# Patient Record
Sex: Female | Born: 1940 | Race: White | Hispanic: No | Smoking: Never smoker
Health system: Southern US, Community
[De-identification: ages and names within clinical notes are randomized; demographics above are authoritative.]

## PROBLEM LIST (undated history)

## (undated) DIAGNOSIS — E7800 Pure hypercholesterolemia, unspecified: Secondary | ICD-10-CM

## (undated) DIAGNOSIS — M1990 Unspecified osteoarthritis, unspecified site: Secondary | ICD-10-CM

## (undated) HISTORY — PX: PARTIAL HYSTERECTOMY: SHX80

---

## 1982-02-01 HISTORY — PX: BREAST BIOPSY: SHX20

## 1998-07-24 ENCOUNTER — Other Ambulatory Visit: Admission: RE | Admit: 1998-07-24 | Discharge: 1998-07-24 | Payer: Self-pay | Admitting: Internal Medicine

## 1998-09-03 ENCOUNTER — Other Ambulatory Visit: Admission: RE | Admit: 1998-09-03 | Discharge: 1998-09-03 | Payer: Self-pay | Admitting: Obstetrics and Gynecology

## 1998-09-25 ENCOUNTER — Ambulatory Visit (HOSPITAL_COMMUNITY): Admission: RE | Admit: 1998-09-25 | Discharge: 1998-09-25 | Payer: Self-pay | Admitting: Gastroenterology

## 1999-05-21 ENCOUNTER — Ambulatory Visit (HOSPITAL_COMMUNITY): Admission: RE | Admit: 1999-05-21 | Discharge: 1999-05-21 | Payer: Self-pay | Admitting: Obstetrics and Gynecology

## 2000-05-03 ENCOUNTER — Other Ambulatory Visit: Admission: RE | Admit: 2000-05-03 | Discharge: 2000-05-03 | Payer: Self-pay | Admitting: *Deleted

## 2000-10-11 ENCOUNTER — Encounter: Admission: RE | Admit: 2000-10-11 | Discharge: 2001-01-09 | Payer: Self-pay | Admitting: *Deleted

## 2001-11-22 ENCOUNTER — Observation Stay (HOSPITAL_COMMUNITY): Admission: RE | Admit: 2001-11-22 | Discharge: 2001-11-23 | Payer: Self-pay | Admitting: Obstetrics and Gynecology

## 2003-09-10 ENCOUNTER — Encounter: Admission: RE | Admit: 2003-09-10 | Discharge: 2003-09-10 | Payer: Self-pay | Admitting: Internal Medicine

## 2003-09-23 ENCOUNTER — Ambulatory Visit (HOSPITAL_COMMUNITY): Admission: RE | Admit: 2003-09-23 | Discharge: 2003-09-23 | Payer: Self-pay | Admitting: Gastroenterology

## 2003-12-05 ENCOUNTER — Encounter: Admission: RE | Admit: 2003-12-05 | Discharge: 2003-12-05 | Payer: Self-pay | Admitting: Neurosurgery

## 2004-06-10 ENCOUNTER — Ambulatory Visit (HOSPITAL_COMMUNITY): Admission: RE | Admit: 2004-06-10 | Discharge: 2004-06-10 | Payer: Self-pay | Admitting: General Surgery

## 2014-02-25 ENCOUNTER — Encounter: Payer: Self-pay | Admitting: Podiatry

## 2014-02-25 ENCOUNTER — Ambulatory Visit: Payer: Self-pay

## 2014-02-25 ENCOUNTER — Ambulatory Visit (INDEPENDENT_AMBULATORY_CARE_PROVIDER_SITE_OTHER): Payer: Medicare Other | Admitting: Podiatry

## 2014-02-25 VITALS — BP 150/69 | HR 109 | Resp 16

## 2014-02-25 DIAGNOSIS — S99921A Unspecified injury of right foot, initial encounter: Secondary | ICD-10-CM

## 2014-02-25 DIAGNOSIS — L600 Ingrowing nail: Secondary | ICD-10-CM

## 2014-02-25 NOTE — Progress Notes (Signed)
   Subjective:    Patient ID: Sarah Benitez, female    DOB: 07/11/1940, 74 y.o.   MRN: [id number]  HPI Comments: "I have a bad toenail"  Patient c/o thick, discolored 1st toenails bilateral for several years. She injured the the right big toe years ago. Can't cut them. Gets sore occasionally.     Review of Systems  Gastrointestinal: Positive for diarrhea.  Musculoskeletal: Positive for back pain and arthralgias.  All other systems reviewed and are negative.      Objective:   Physical Exam        Assessment & Plan:

## 2014-02-25 NOTE — Progress Notes (Signed)
Subjective:     Patient ID: Sarah LickNancy S Benitez, female   DOB: 01/04/1941, 74 y.o.   MRN: [id number]  HPI patient is found to have brittle thick painful nailbeds hallux bilateral that she cannot cut they're becoming loose and increasingly difficult to wear shoe gear with   Review of Systems  All other systems reviewed and are negative.      Objective:   Physical Exam  Constitutional: She is oriented to person, place, and time.  Cardiovascular: Intact distal pulses.   Musculoskeletal: Normal range of motion.  Neurological: She is oriented to person, place, and time.  Skin: Skin is warm.  Nursing note and vitals reviewed.  neurovascular status found to be intact with muscle strength adequate and range of motion subtalar and midtarsal joint within normal limits. 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 I have recommended removal of the nails and I explained procedure and risk and the fact they will most likely not regrow. Patient wants the surgery and understands risk and today I infiltrated 60 mg Xylocaine Marcaine mixture remove each hallux nail exposed the matrix and apply chemical phenol 3 applications 30 seconds followed by alcohol lavaged and sterile dressing. Reappoint for us to recheck

## 2014-02-25 NOTE — Patient Instructions (Signed)

## 2014-03-22 ENCOUNTER — Encounter: Payer: Self-pay | Admitting: Podiatry

## 2014-03-22 ENCOUNTER — Telehealth: Payer: Self-pay | Admitting: *Deleted

## 2014-03-22 ENCOUNTER — Ambulatory Visit (INDEPENDENT_AMBULATORY_CARE_PROVIDER_SITE_OTHER): Payer: Medicare Other | Admitting: Podiatry

## 2014-03-22 VITALS — BP 133/80 | HR 67 | Resp 12

## 2014-03-22 DIAGNOSIS — Z9889 Other specified postprocedural states: Secondary | ICD-10-CM

## 2014-03-22 DIAGNOSIS — L03039 Cellulitis of unspecified toe: Secondary | ICD-10-CM

## 2014-03-22 MED ORDER — CEPHALEXIN 500 MG PO CAPS: 500.0000 mg | ORAL_CAPSULE | Freq: Three times a day (TID) | ORAL | Status: DC

## 2014-03-22 NOTE — Patient Instructions (Signed)

## 2014-03-22 NOTE — Telephone Encounter (Signed)
Pt states had toenails removed about 4 weeks ago and now 1 is red, swollen from nail base to the 1st joint.  I encouraged pt to be seen today by our doctor, pt agreed.  Transferred to schedulers.

## 2014-03-25 NOTE — Progress Notes (Signed)
Patient ID: Sarah LickNancy S Nordlund, female   DOB: 08/15/1940, 74 y.o.   MRN: [id number]  Subjective: 74 year old female presents the office today with complaints of bilateral big toenails are still tender and had a small amount of clear drainage and bleeding. She recently underwent bilateral hallux nail excision on 02/25/2014. She states that she has continue to soak and antibacterial soap soaks daily, with antibiotic ointment and a Band-Aid. She states there is a small amount of redness around the nail sites however she denies any red streaking. She denies any purulence from the area. Denies any systemic complaints as fevers, chills, nausea, vomiting. No other complaints at this time no acute changes since last appointment.  Objective: AAO 3, NAD DP/PT pulses palpable, CRT less than 3 seconds Protective sensation intact with Simms Weinstein monofilament, vibratory sensation intact, Achilles tendon reflex intact. Status post bilateral hallux nail excisions. There is a small amount of serosanguineous drainage from along the nail sites. There is mild tenderness along the procedure site. There is mild surrounding erythema directly around the proximal nail border however there is no ascending cellulitis. There is no purulence identified. There is no surrounding fluctuance or crepitus. No malodor. Remaining nails without pathology. No other areas of tenderness to bilateral lower extremity's. No other areas of edema, erythema, increased warmth. No pain with calf compression, swelling, warmth, erythema.  Assessment: 74 year old female presents for bilateral hallux pain, drainage, erythema status post bilateral hallux nail excision with phenol application.  Plan: -Treatment options were discussed with the patient that he alternatives, risks, complications. -At this time recommended to switch to Epsom salt soaks twice a day covering with antibiotic ointment and a Band-Aid. Prescribed Keflex. Monitor for any signs or  symptoms of infection directed to call the office immediately should any occur. Also discuss that after phenol application the area does create a localized burn and she can have small amount of redness or drainage from that as well. -Follow-up in 2 weeks or sooner if any palms are to arise or any worsening of symptoms. In the meantime, encouraged to call the office with any questions, concerns, change in symptoms.

## 2014-04-05 ENCOUNTER — Ambulatory Visit (INDEPENDENT_AMBULATORY_CARE_PROVIDER_SITE_OTHER): Payer: Medicare Other | Admitting: Podiatry

## 2014-04-05 ENCOUNTER — Encounter: Payer: Self-pay | Admitting: Podiatry

## 2014-04-05 VITALS — BP 164/82 | HR 76 | Resp 12

## 2014-04-05 DIAGNOSIS — Z9889 Other specified postprocedural states: Secondary | ICD-10-CM

## 2014-04-05 DIAGNOSIS — M79676 Pain in unspecified toe(s): Secondary | ICD-10-CM

## 2014-04-05 MED ORDER — CEPHALEXIN 500 MG PO CAPS: 500.0000 mg | ORAL_CAPSULE | Freq: Two times a day (BID) | ORAL | Status: DC

## 2014-04-05 NOTE — Patient Instructions (Signed)

## 2014-04-07 NOTE — Progress Notes (Signed)
Patient ID: Sarah LickNancy S Benitez, female   DOB: 01/27/1941, 74 y.o.   MRN: [id number]  Subjective: Sarah Benitez presents the office today for follow-up of pain to bilateral hallux s/p total nail avulsions with chemcial matrixetomy. She has since finished her antibiotics. He does state the left side is still a little red and they are both sore, especially with pressure although she does state this is improving. She  has continued to soak daily with epsom salts and cover with antibiotic ointment and a Band-Aid. She reports a small amount of clear drainage from the area but no pus. Denies any systemic complaints as fevers, chills, nausea, vomiting. No other complaints at this time no acute changes since last appointment.  Objective: AAO 3, NAD DP/PT pulses palpable, CRT less than 3 seconds Protective sensation intact with Simms Weinstein monofilament, vibratory sensation intact, Achilles tendon reflex intact. Status post bilateral hallux nail avulsions with a small scab formation within the nailbed. There is a small amount of serous drainage from along the proximal nail borders on the left > right, but no purulence is expressed. There is a small amount of erythema localized to the proximal nail border on the left and to a smaller amount on the right. There is no ascending cellulitis. There are no areas of fluctuance or crepitance. No malodor.  There is mild tenderness along the procedure site, although she does report it has improved. Remaining nails without pathology. No other areas of tenderness to bilateral lower extremities. No other areas of edema, erythema, increased warmth. No pain with calf compression, swelling, warmth, erythema.  Assessment: 74 year old female presents for follow-up evaluation of bilateral hallux pain/erythema status post bilateral hallux nail excision with phenol application.  Plan: -Treatment options were discussed with the patient that he alternatives, risks, complications. -At this  time recommended to continue soaking in Epsom salt twice a day covering with antibiotic ointment and a Band-Aid. Monitor for any signs or symptoms of infection directed to call the office immediately should any occur. Also discuss that after phenol application the area does create a localized burn and she can have small amount of redness or drainage from that as well. -The proximal nail border was curetted to allow the area to drain. I prescribed keflex in case she notices any purulence or any worsening of erythema.  -Follow-up in 2 weeks or sooner if any problems are to arise or any worsening of symptoms. In the meantime, encouraged to call the office with any questions, concerns, change in symptoms.

## 2014-07-28 ENCOUNTER — Encounter (HOSPITAL_COMMUNITY): Payer: Self-pay | Admitting: Emergency Medicine

## 2014-07-28 ENCOUNTER — Observation Stay (HOSPITAL_COMMUNITY)
Admission: EM | Admit: 2014-07-28 | Discharge: 2014-07-30 | Disposition: A | Discharge status: Home / Self Care | Payer: Medicare Other | Attending: General Surgery | Admitting: General Surgery

## 2014-07-28 ENCOUNTER — Emergency Department (HOSPITAL_COMMUNITY): Payer: Medicare Other

## 2014-07-28 DIAGNOSIS — E780 Pure hypercholesterolemia: Secondary | ICD-10-CM | POA: Insufficient documentation

## 2014-07-28 DIAGNOSIS — Z888 Allergy status to other drugs, medicaments and biological substances status: Secondary | ICD-10-CM | POA: Insufficient documentation

## 2014-07-28 DIAGNOSIS — Z7982 Long term (current) use of aspirin: Secondary | ICD-10-CM | POA: Diagnosis not present

## 2014-07-28 DIAGNOSIS — K810 Acute cholecystitis: Secondary | ICD-10-CM | POA: Diagnosis present

## 2014-07-28 DIAGNOSIS — M1990 Unspecified osteoarthritis, unspecified site: Secondary | ICD-10-CM | POA: Insufficient documentation

## 2014-07-28 DIAGNOSIS — K8012 Calculus of gallbladder with acute and chronic cholecystitis without obstruction: Principal | ICD-10-CM | POA: Insufficient documentation

## 2014-07-28 DIAGNOSIS — R1011 Right upper quadrant pain: Secondary | ICD-10-CM | POA: Diagnosis present

## 2014-07-28 DIAGNOSIS — R109 Unspecified abdominal pain: Secondary | ICD-10-CM

## 2014-07-28 DIAGNOSIS — Z419 Encounter for procedure for purposes other than remedying health state, unspecified: Secondary | ICD-10-CM

## 2014-07-28 DIAGNOSIS — R079 Chest pain, unspecified: Secondary | ICD-10-CM

## 2014-07-28 HISTORY — DX: Unspecified osteoarthritis, unspecified site: M19.90

## 2014-07-28 HISTORY — DX: Pure hypercholesterolemia, unspecified: E78.00

## 2014-07-28 LAB — I-STAT TROPONIN, ED: Troponin i, poc: 0 ng/mL (ref 0.00–0.08)

## 2014-07-28 LAB — COMPREHENSIVE METABOLIC PANEL
ALT: 17 U/L (ref 14–54)
AST: 30 U/L (ref 15–41)
Albumin: 3.8 g/dL (ref 3.5–5.0)
Alkaline Phosphatase: 64 U/L (ref 38–126)
Anion gap: 8 (ref 5–15)
BUN: 15 mg/dL (ref 6–20)
CO2: 25 mmol/L (ref 22–32)
Calcium: 9 mg/dL (ref 8.9–10.3)
Chloride: 108 mmol/L (ref 101–111)
Creatinine, Ser: 0.69 mg/dL (ref 0.44–1.00)
GFR calc Af Amer: >60 mL/min (ref >60)
GFR calc non Af Amer: >60 mL/min (ref >60)
Glucose, Bld: 125 mg/dL — ABNORMAL HIGH (ref 65–99)
Potassium: 3.6 mmol/L (ref 3.5–5.1)
Sodium: 141 mmol/L (ref 135–145)
Total Bilirubin: 1.1 mg/dL (ref 0.3–1.2)
Total Protein: 7.2 g/dL (ref 6.5–8.1)

## 2014-07-28 LAB — CBC
HCT: 39.4 % (ref 36.0–46.0)
Hemoglobin: 13.3 g/dL (ref 12.0–15.0)
MCH: 33.8 pg (ref 26.0–34.0)
MCHC: 33.8 g/dL (ref 30.0–36.0)
MCV: 100 fL (ref 78.0–100.0)
Platelets: 228 10*3/uL (ref 150–400)
RBC: 3.94 MIL/uL (ref 3.87–5.11)
RDW: 12.3 % (ref 11.5–15.5)
WBC: 7.9 10*3/uL (ref 4.0–10.5)

## 2014-07-28 LAB — LIPASE, BLOOD: Lipase: 22 U/L (ref 22–51)

## 2014-07-28 MED ORDER — HYDROCODONE-ACETAMINOPHEN 5-325 MG PO TABS
1.0000 | ORAL_TABLET | ORAL | Status: DC | PRN
  Administered 2014-07-28: 1 via ORAL
  Administered 2014-07-29: 2 via ORAL
  Administered 2014-07-30: 1 via ORAL
  Filled 2014-07-28 (×2): qty 1
  Filled 2014-07-28: qty 2

## 2014-07-28 MED ORDER — LIDOCAINE VISCOUS 2 % MT SOLN
15.0000 mL | Freq: Once | OROMUCOSAL | Status: AC
  Administered 2014-07-28: 15 mL via OROMUCOSAL
  Filled 2014-07-28: qty 15

## 2014-07-28 MED ORDER — ALUM & MAG HYDROXIDE-SIMETH 200-200-20 MG/5ML PO SUSP
15.0000 mL | Freq: Once | ORAL | Status: AC
  Administered 2014-07-28: 15 mL via ORAL
  Filled 2014-07-28: qty 30

## 2014-07-28 MED ORDER — MORPHINE SULFATE 4 MG/ML IJ SOLN
4.0000 mg | Freq: Once | INTRAMUSCULAR | Status: AC
  Administered 2014-07-28: 4 mg via INTRAVENOUS
  Filled 2014-07-28: qty 1

## 2014-07-28 MED ORDER — NABUMETONE 500 MG PO TABS
500.0000 mg | ORAL_TABLET | Freq: Every day | ORAL | Status: DC
  Administered 2014-07-30: 500 mg via ORAL
  Filled 2014-07-28 (×2): qty 1

## 2014-07-28 MED ORDER — FENTANYL CITRATE (PF) 100 MCG/2ML IJ SOLN
50.0000 ug | Freq: Once | INTRAMUSCULAR | Status: AC
  Administered 2014-07-28: 50 ug via INTRAVENOUS
  Filled 2014-07-28: qty 2

## 2014-07-28 MED ORDER — ENOXAPARIN SODIUM 40 MG/0.4ML ~~LOC~~ SOLN
40.0000 mg | Freq: Every day | SUBCUTANEOUS | Status: DC
  Administered 2014-07-28 – 2014-07-29 (×2): 40 mg via SUBCUTANEOUS
  Filled 2014-07-28 (×3): qty 0.4

## 2014-07-28 MED ORDER — ASPIRIN 325 MG PO TABS
325.0000 mg | ORAL_TABLET | Freq: Once | ORAL | Status: AC
  Administered 2014-07-28: 325 mg via ORAL
  Filled 2014-07-28: qty 1

## 2014-07-28 MED ORDER — ONDANSETRON HCL 4 MG/2ML IJ SOLN
4.0000 mg | Freq: Four times a day (QID) | INTRAMUSCULAR | Status: DC | PRN
  Administered 2014-07-29 (×2): 4 mg via INTRAVENOUS
  Filled 2014-07-28: qty 2

## 2014-07-28 MED ORDER — POTASSIUM CHLORIDE IN NACL 20-0.9 MEQ/L-% IV SOLN
INTRAVENOUS | Status: DC
  Administered 2014-07-28: via INTRAVENOUS
  Filled 2014-07-28 (×2): qty 1000

## 2014-07-28 MED ORDER — DEXTROSE 5 % IV SOLN
1.0000 g | Freq: Once | INTRAVENOUS | Status: AC
  Administered 2014-07-28: 1 g via INTRAVENOUS
  Filled 2014-07-28: qty 10

## 2014-07-28 MED ORDER — NITROGLYCERIN 0.4 MG SL SUBL: 0.4000 mg | SUBLINGUAL_TABLET | SUBLINGUAL | Status: DC | PRN

## 2014-07-28 MED ORDER — MORPHINE SULFATE 2 MG/ML IJ SOLN
1.0000 mg | INTRAMUSCULAR | Status: DC | PRN
  Administered 2014-07-28 – 2014-07-29 (×2): 4 mg via INTRAVENOUS
  Administered 2014-07-29: 2 mg via INTRAVENOUS
  Filled 2014-07-28: qty 2
  Filled 2014-07-28: qty 1
  Filled 2014-07-28: qty 2

## 2014-07-28 MED ORDER — LEVOTHYROXINE SODIUM 112 MCG PO TABS
112.0000 ug | ORAL_TABLET | Freq: Every day | ORAL | Status: DC
  Administered 2014-07-30: 112 ug via ORAL
  Filled 2014-07-28 (×3): qty 1

## 2014-07-28 MED ORDER — PIPERACILLIN-TAZOBACTAM 3.375 G IVPB
3.3750 g | Freq: Three times a day (TID) | INTRAVENOUS | Status: DC
  Administered 2014-07-28 – 2014-07-29 (×2): 3.375 g via INTRAVENOUS
  Filled 2014-07-28 (×3): qty 50

## 2014-07-28 NOTE — ED Provider Notes (Signed)
CSN: [id number]     Arrival date & time 07/28/14  1534 History   First MD Initiated Contact with Patient 07/28/14 1559     Chief Complaint  Patient presents with  . Chest Pain  . Abdominal Pain     (Consider location/radiation/quality/duration/timing/severity/associated sxs/prior Treatment) HPI Comments: Sarah Benitez is a 74 y.o. female with a PMHx of arthritis and HLD, who presents to the ED with complaints of epigastric chest/abdominal pain that has been ongoing for 2 days but worsened today after lunch when she ate chicken, mac & cheese, green beans, and a slice of cheese cake. She states the pain is 8/10 pressure-like epigastric/right upper quadrant pain, radiating to between her shoulder blades in the back, previously intermittent but today became more constant, worse with eating, and improved with Tums. Unchanged with exertion or breathing, and not radiating to the jaw or arm. She reports some shortness of breath with exertion intermittently, and since allergy season started she has had a cough intermittently with occasional green sputum production but she states this is completely related to her allergies. She denies any recent fevers, chills, wheezing, hemoptysis, leg swelling, claudication, orthopnea, PND, recent travel/surgery/immobilization, history of DVT/PE, estrogen use or active cancer, nausea, vomiting, diarrhea, constipation, melena, dysuria, hematuria, vaginal bleeding or discharge, numbness, tingling, weakness, diaphoresis, lightheadedness, dizziness, headache, vision changes, suspicious food intake, sick contacts, alcohol use, NSAID use, or antibiotics. She is a nonsmoker. She has a family history of MI in her father and her brother but she has no personal history of cardiac disease.  Patient is a 74 y.o. female presenting with chest pain and abdominal pain. The history is provided by the patient. No language interpreter was used.  Chest Pain Pain location:  Epigastric Pain  quality: pressure   Pain radiates to:  Mid back Pain radiates to the back: yes   Pain severity:  Moderate Onset quality:  Gradual Duration:  2 days Timing:  Constant Progression:  Unchanged Chronicity:  New Context: eating   Relieved by:  Antacids Exacerbated by: eating. Ineffective treatments:  None tried Associated symptoms: abdominal pain, cough (intermittent with allergies, occasionally productive) and heartburn   Associated symptoms: no claudication, no diaphoresis, no dizziness, no fever, no headache, no lower extremity edema, no nausea, no numbness, no orthopnea, no PND, no shortness of breath, not vomiting and no weakness   Risk factors: high cholesterol   Risk factors: no birth control, no coronary artery disease, no diabetes mellitus, no hypertension, no immobilization, no prior DVT/PE, no smoking and no surgery   Abdominal Pain Associated symptoms: chest pain and cough (intermittent with allergies, occasionally productive)   Associated symptoms: no chills, no constipation, no diarrhea, no dysuria, no fever, no hematuria, no nausea, no shortness of breath, no vaginal bleeding, no vaginal discharge and no vomiting     Past Medical History  Diagnosis Date  . Arthritis   . High cholesterol    Past Surgical History  Procedure Laterality Date  . Breast biopsy  1984  . Partial hysterectomy     No family history on file. History  Substance Use Topics  . Smoking status: Never Smoker   . Smokeless tobacco: Not on file  . Alcohol Use: No   OB History    No data available     Review of Systems  Constitutional: Negative for fever, chills and diaphoresis.  Eyes: Negative for visual disturbance.  Respiratory: Positive for cough (intermittent with allergies, occasionally productive). Negative for shortness of breath and  wheezing.   Cardiovascular: Positive for chest pain. Negative for orthopnea, claudication, leg swelling and PND.  Gastrointestinal: Positive for heartburn  and abdominal pain. Negative for nausea, vomiting, diarrhea, constipation and blood in stool.  Genitourinary: Negative for dysuria, hematuria, flank pain, vaginal bleeding and vaginal discharge.  Musculoskeletal: Negative for myalgias, arthralgias and neck pain.  Skin: Negative for color change.  Allergic/Immunologic: Negative for immunocompromised state.  Neurological: Negative for dizziness, weakness, light-headedness, numbness and headaches.  Psychiatric/Behavioral: Negative for confusion.   10 Systems reviewed and are negative for acute change except as noted in the HPI.    Allergies  Allegra; Lipitor; and Betadine  Home Medications   Prior to Admission medications   Medication Sig Start Date End Date Taking? Authorizing Provider  acetaminophen (TYLENOL) 500 MG tablet Take 500 mg by mouth every 6 (six) hours as needed.    Historical Provider, MD  aspirin 81 MG tablet Take 81 mg by mouth daily.    Historical Provider, MD  Calcium Carbonate (CALTRATE 600 PO) Take by mouth.    Historical Provider, MD  cephALEXin (KEFLEX) 500 MG capsule Take 1 capsule (500 mg total) by mouth 2 (two) times daily. 04/05/14   Vivi Barrack, DPM  Ginkgo Biloba (GINKOBA PO) Take by mouth.    Historical Provider, MD  Glucosamine-Chondroitin (GLUCOSAMINE CHONDR COMPLEX PO) Take by mouth.    Historical Provider, MD  levothyroxine (SYNTHROID, LEVOTHROID) 112 MCG tablet Take 112 mcg by mouth daily before breakfast.    Historical Provider, MD  Multiple Vitamin (MULTIVITAMIN) capsule Take 1 capsule by mouth daily.    Historical Provider, MD  nabumetone (RELAFEN) 500 MG tablet Take 500 mg by mouth daily.    Historical Provider, MD  Omega-3 Fatty Acids (OMEGA-3 FISH OIL PO) Take by mouth.    Historical Provider, MD  simvastatin (ZOCOR) 20 MG tablet Take 20 mg by mouth daily.    Historical Provider, MD  ST JOHNS WORT EXTRACT PO Take by mouth.    Historical Provider, MD   BP 177/78 mmHg  Pulse 85  Temp(Src) 97.7  F (36.5 C) (Oral)  Resp 16  SpO2 97% Physical Exam  Constitutional: She is oriented to person, place, and time. Vital signs are normal. She appears well-developed and well-nourished.  Non-toxic appearance. No distress.  Afebrile, nontoxic, NAD  HENT:  Head: Normocephalic and atraumatic.  Mouth/Throat: Oropharynx is clear and moist and mucous membranes are normal.  Eyes: Conjunctivae and EOM are normal. Right eye exhibits no discharge. Left eye exhibits no discharge.  Neck: Normal range of motion. Neck supple.  Cardiovascular: Normal rate, regular rhythm, normal heart sounds and intact distal pulses.  Exam reveals no gallop and no friction rub.   No murmur heard. RRR, nl s1/s2, no m/r/g, distal pulses intact, no pedal edema   Pulmonary/Chest: Effort normal and breath sounds normal. No respiratory distress. She has no decreased breath sounds. She has no wheezes. She has no rhonchi. She has no rales. She exhibits no tenderness, no bony tenderness, no crepitus and no retraction.  CTAB in all lung fields, no w/r/r, no hypoxia or increased WOB, speaking in full sentences, SpO2 97% on RA  No chest wall TTP, no crepitus or deformity, no retractions  Abdominal: Soft. Normal appearance and bowel sounds are normal. She exhibits no distension. There is tenderness in the right upper quadrant and epigastric area. There is positive Murphy's sign. There is no rigidity, no rebound, no guarding, no CVA tenderness and no tenderness at McBurney's point.  Soft, nondistended, +BS throughout, with TTP in epigastrum and RUQ, no r/g/r, +murphy's, neg mcburney's, no CVA TTP   Musculoskeletal: Normal range of motion.  MAE x4 Strength and sensation grossly intact Distal pulses intact No pedal edema, neg homan's bilaterally   Neurological: She is alert and oriented to person, place, and time. She has normal strength. No sensory deficit.  Skin: Skin is warm, dry and intact. No rash noted.  Psychiatric: She has a  normal mood and affect.  Nursing note and vitals reviewed.   ED Course  Procedures (including critical care time) Labs Review Labs Reviewed  COMPREHENSIVE METABOLIC PANEL - Abnormal; Notable for the following:    Glucose, Bld 125 (*)    All other components within normal limits  CBC  LIPASE, BLOOD  I-STAT TROPOININ, ED    Imaging Review Dg Chest 2 View  07/28/2014   CLINICAL DATA:  Epigastric pain for 2 days with back pain neck pain chest discomfort nausea  EXAM: CHEST  2 VIEW  COMPARISON:  None.  FINDINGS: Heart size upper normal. Uncoiling of the aorta. Vascular pattern normal. Poor inspiratory effect leads to bilateral lower lobe atelectasis. No consolidation or effusion.  IMPRESSION: No active cardiopulmonary disease.   Electronically Signed   By: Esperanza Heir M.D.   On: 07/28/2014 16:52   US Abdomen Complete  07/28/2014   CLINICAL DATA:  Right upper quadrant pain. Back pain under the scapula.  EXAM: ULTRASOUND ABDOMEN COMPLETE  COMPARISON:  None.  FINDINGS: Gallbladder: Shadowing for the gallbladder wall may be related to diffuse wall calcification versus stone filled gallbladder. There is a discrete 1.9 cm stone noted at the gallbladder neck with additional stones and sludge within the gallbladder lumen. Gallbladder wall appears thickened measuring 8 mm. There is questionable small amount of pericholecystic fluid. Sonographic Murphy sign is positive.  Common bile duct: Diameter: 1.3 cm proximally, 5 mm distally.  Liver: No focal lesion identified. Within normal limits in parenchymal echogenicity. There is normal directional flow in the main portal vein.  IVC: No abnormality visualized.  Pancreas: Visualized portion unremarkable. Normal caliber pancreatic duct rib visualize measuring 2 mm.  Spleen: Size and appearance within normal limits.  Right Kidney: Length: 11.7 cm. Echogenicity within normal limits. No mass or hydronephrosis visualized.  Left Kidney: Length: 10.8 cm. Echogenicity  within normal limits. No mass or hydronephrosis visualized.  Abdominal aorta: No aneurysm visualized.  Other findings: None.  IMPRESSION: 1. Constellation of findings consistent with acute cholecystitis. Gallbladder filled with echogenic stones and sludge, with a stone at the gallbladder neck. 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 2. Dilated proximal common bile duct measuring 1.3 cm, with normal caliber distal common bile duct measuring 5 mm. 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   Electronically Signed   By: Rubye Oaks M.D.   On: 07/28/2014 19:18     EKG Interpretation   Date/Time:  Sunday July 28 2014 15:42:09 EDT Ventricular Rate:  83 PR Interval:  198 QRS Duration: 80 QT Interval:  427 QTC Calculation: 502 R Axis:   -27 Text Interpretation:  Sinus rhythm Anterolateral infarct, old Prolonged QT  interval Baseline wander in lead(s) III aVL Confirmed by DOCHERTY  MD,  MEGAN ([id number]) on 07/28/2014 3:46:59 PM      MDM   Final diagnoses:  Chest pain  Acute cholecystitis    74 y.o. female here with epigastric/RUQ/LUQ pain x2 days, worsened today after eating meal, radiating to between shoulder  blades, improved somewhat with tums, associated with mild SOB with exertion. Has had intermittent cough with occasional sputum for months, related to her allergies. +FHx of MI in father and brother, but no cardiac disease in herself, and nonsmoker. On exam, pain is in epigastrum and RUQ with +Murphy's. Will get labs, EKG, trop, CXR, abd u/s, and give GI cocktail with ASA. If unrelieved, will try NTG and morphine. No tachycardia or hypoxia, no LE swelling, no RFs for DVT/PE. Will reassess shortly.   7:25 PM Trop neg. CBC unremarkable. CMP showing mildly elevated glucose at 125 without other abnormalities. Lipase WNL. U/S abd showing acute cholecystisis with stone in the GB neck. CXR clear. EKG with long QT but otherwise  WNL. Will consult surgery. Will give rocephin. Pain now increased after U/S, will proceed with morphine. Pain initially had improved but worsened after U/S.  7:41 PM Dr. Magnus Ivan returning page, will consult on pt and proceed with intervention. Please see his notes for further documentation of care.  BP 154/60 mmHg  Pulse 75  Temp(Src) 97.7 F (36.5 C) (Oral)  Resp 22  SpO2 93%  Meds ordered this encounter  Medications  . aspirin tablet 325 mg    Sig:   . nitroGLYCERIN (NITROSTAT) SL tablet 0.4 mg    Sig:   . morphine 4 MG/ML injection 4 mg    Sig:   . alum & mag hydroxide-simeth (MAALOX/MYLANTA) 200-200-20 MG/5ML suspension 15 mL    Sig:   . lidocaine (XYLOCAINE) 2 % viscous mouth solution 15 mL    Sig:   . fentaNYL (SUBLIMAZE) injection 50 mcg    Sig:   . cefTRIAXone (ROCEPHIN) 1 g in dextrose 5 % 50 mL IVPB    Sig:      Abby Tucholski Camprubi-Soms, PA-C 07/28/14 1946  Purvis Sheffield, MD 07/29/14 1638

## 2014-07-28 NOTE — ED Notes (Signed)
Awake. Verbally responsive. A/O x4. Resp even and unlabored. No audible adventitious breath sounds noted. ABC's intact. SR on monitor. IV saline lock patent and intact. Family at bedside. 

## 2014-07-28 NOTE — Progress Notes (Signed)
ANTIBIOTIC CONSULT NOTE - INITIAL  Pharmacy Consult for zosyn Indication: cholecystitis  Allergies  Allergen Reactions  . Allegra [Fexofenadine]   . Lipitor [Atorvastatin]   . Betadine [Povidone Iodine] Rash    Patient Measurements:   Adjusted Body Weight:   Vital Signs: Temp: 98.8 F (37.1 C) (06/26 2235) Temp Source: Oral (06/26 1543) BP: 148/78 mmHg (06/26 2235) Pulse Rate: 84 (06/26 2235) Intake/Output from previous day:   Intake/Output from this shift:    Labs:  Recent Labs  07/28/14 1605  WBC 7.9  HGB 13.3  PLT 228  CREATININE 0.69   CrCl cannot be calculated (Unknown ideal weight.). No results for input(s): VANCOTROUGH, VANCOPEAK, VANCORANDOM, GENTTROUGH, GENTPEAK, GENTRANDOM, TOBRATROUGH, TOBRAPEAK, TOBRARND, AMIKACINPEAK, AMIKACINTROU, AMIKACIN in the last 72 hours.   Microbiology: No results found for this or any previous visit (from the past 720 hour(s)).  Medical History: Past Medical History  Diagnosis Date  . Arthritis   . High cholesterol     Medications:  Anti-infectives    Start     Dose/Rate Route Frequency Ordered Stop   07/28/14 2315  piperacillin-tazobactam (ZOSYN) IVPB 3.375 g     3.375 g 12.5 mL/hr over 240 Minutes Intravenous 3 times per day 07/28/14 2304     07/28/14 1930  cefTRIAXone (ROCEPHIN) 1 g in dextrose 5 % 50 mL IVPB     1 g 100 mL/hr over 30 Minutes Intravenous  Once 07/28/14 1924 07/28/14 2117     Assessment: Patient with abdominal pain.  Zosyn for acute cholecystitis. Height and weight not known at this time.  Goal of Therapy:  Zosyn based on renal function   Plan:  Follow up culture results  Zosyn 3.375g IV Q8H infused over 4hrs.  Follow with height and weight, adjust zosyn if needed  Aleene Davidson Crowford 07/28/2014,11:07 PM

## 2014-07-28 NOTE — ED Notes (Signed)
Pt states that since yesterday she has had pain under her L breast, abdomen and today, between her shoulder blades. Alert and oriented.

## 2014-07-28 NOTE — H&P (Signed)
Sarah Benitez is an 74 y.o. female.   Chief Complaint: Right upper quadrant abdominal pain HPI: This pleasant female presents with a 2 day history of right upper quadrant abdominal pain hurting through to her back.  This occurred after a fatty meal and then got worse today after another fatty meal. The pain is sharp and severe.  She has no nausea or vomiting. She has had no similar attacks of pain. She is otherwise without complaints.  Past Medical History  Diagnosis Date  . Arthritis   . High cholesterol     Past Surgical History  Procedure Laterality Date  . Breast biopsy  1984  . Partial hysterectomy      No family history on file. Social History:  reports that she has never smoked. She does not have any smokeless tobacco history on file. She reports that she does not drink alcohol or use illicit drugs.  Allergies:  Allergies  Allergen Reactions  . Allegra [Fexofenadine]   . Lipitor [Atorvastatin]   . Betadine [Povidone Iodine] Rash     (Not in a hospital admission)  Results for orders placed or performed during the hospital encounter of 07/28/14 (from the past 48 hour(s))  CBC     Status: None   Collection Time: 07/28/14  4:05 PM  Result Value Ref Range   WBC 7.9 4.0 - 10.5 K/uL   RBC 3.94 3.87 - 5.11 MIL/uL   Hemoglobin 13.3 12.0 - 15.0 g/dL   HCT 39.4 36.0 - 46.0 %   MCV 100.0 78.0 - 100.0 fL   MCH 33.8 26.0 - 34.0 pg   MCHC 33.8 30.0 - 36.0 g/dL   RDW 12.3 11.5 - 15.5 %   Platelets 228 150 - 400 K/uL  Comprehensive metabolic panel     Status: Abnormal   Collection Time: 07/28/14  4:05 PM  Result Value Ref Range   Sodium 141 135 - 145 mmol/L   Potassium 3.6 3.5 - 5.1 mmol/L   Chloride 108 101 - 111 mmol/L   CO2 25 22 - 32 mmol/L   Glucose, Bld 125 (H) 65 - 99 mg/dL   BUN 15 6 - 20 mg/dL   Creatinine, Ser 0.69 0.44 - 1.00 mg/dL   Calcium 9.0 8.9 - 10.3 mg/dL   Total Protein 7.2 6.5 - 8.1 g/dL   Albumin 3.8 3.5 - 5.0 g/dL   AST 30 15 - 41 U/L   ALT 17 14 -  54 U/L   Alkaline Phosphatase 64 38 - 126 U/L   Total Bilirubin 1.1 0.3 - 1.2 mg/dL   GFR calc non Af Amer >60 >60 mL/min   GFR calc Af Amer >60 >60 mL/min    Comment: (NOTE) The eGFR has been calculated using the CKD EPI equation. This calculation has not been validated in all clinical situations. eGFR's persistently <60 mL/min signify possible Chronic Kidney Disease.    Anion gap 8 5 - 15  Lipase, blood     Status: None   Collection Time: 07/28/14  4:05 PM  Result Value Ref Range   Lipase 22 22 - 51 U/L  I-stat troponin, ED  (not at [facility])     Status: None   Collection Time: 07/28/14  4:18 PM  Result Value Ref Range   Troponin i, poc 0.00 0.00 - 0.08 ng/mL   Comment 3            Comment: Due to the release kinetics of cTnI, a negative result within the  first hours of the onset of symptoms does not rule out myocardial infarction with certainty. If myocardial infarction is still suspected, repeat the test at appropriate intervals.    Dg Chest 2 View  07/28/2014   CLINICAL DATA:  Epigastric pain for 2 days with back pain neck pain chest discomfort nausea  EXAM: CHEST  2 VIEW  COMPARISON:  None.  FINDINGS: Heart size upper normal. Uncoiling of the aorta. Vascular pattern normal. Poor inspiratory effect leads to bilateral lower lobe atelectasis. No consolidation or effusion.  IMPRESSION: No active cardiopulmonary disease.   Electronically Signed   By: Skipper Cliche M.D.   On: 07/28/2014 16:52   US Abdomen Complete  07/28/2014   CLINICAL DATA:  Right upper quadrant pain. Back pain under the scapula.  EXAM: ULTRASOUND ABDOMEN COMPLETE  COMPARISON:  None.  FINDINGS: Gallbladder: Shadowing for the gallbladder wall may be related to diffuse wall calcification versus stone filled gallbladder. There is a discrete 1.9 cm stone noted at the gallbladder neck with additional stones and sludge within the gallbladder lumen. Gallbladder wall appears thickened measuring 8 mm. There is  questionable small amount of pericholecystic fluid. Sonographic Murphy sign is positive.  Common bile duct: Diameter: 1.3 cm proximally, 5 mm distally.  Liver: No focal lesion identified. Within normal limits in parenchymal echogenicity. There is normal directional flow in the main portal vein.  IVC: No abnormality visualized.  Pancreas: Visualized portion unremarkable. Normal caliber pancreatic duct rib visualize measuring 2 mm.  Spleen: Size and appearance within normal limits.  Right Kidney: Length: 11.7 cm. Echogenicity within normal limits. No mass or hydronephrosis visualized.  Left Kidney: Length: 10.8 cm. Echogenicity within normal limits. No mass or hydronephrosis visualized.  Abdominal aorta: No aneurysm visualized.  Other findings: None.  IMPRESSION: 1. Constellation of findings consistent with acute cholecystitis. Gallbladder filled with echogenic stones and sludge, with a stone at the gallbladder neck. 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 2. Dilated proximal common bile duct measuring 1.3 cm, with normal caliber distal common bile duct measuring 5 mm. 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   Electronically Signed   By: Jeb Levering M.D.   On: 07/28/2014 19:18    Review of Systems  All other systems reviewed and are negative.   Blood pressure 154/60, pulse 75, temperature 97.7 F (36.5 C), temperature source Oral, resp. rate 22, SpO2 93 %. Physical Exam  Constitutional: She is oriented to person, place, and time. She appears well-developed and well-nourished. No distress.  HENT:  Head: Normocephalic and atraumatic.  Right Ear: External ear normal.  Left Ear: External ear normal.  Nose: Nose normal.  Mouth/Throat: No oropharyngeal exudate.  Eyes: Conjunctivae are normal. Pupils are equal, round, and reactive to light. No scleral icterus.  Neck: Normal range of motion. No tracheal deviation present.   Cardiovascular: Normal rate, regular rhythm and normal heart sounds.   No murmur heard. Respiratory: Effort normal and breath sounds normal. No respiratory distress. She has no wheezes.  GI: Soft. There is tenderness. There is guarding.  There is tenderness with guarding in the right upper quadrant  Musculoskeletal: Normal range of motion. She exhibits no edema.  Neurological: She is alert and oriented to person, place, and time.  Skin: Skin is dry.  Psychiatric: Her behavior is normal. Judgment normal.     Assessment/Plan Acute cholecystitis with cholelithiasis  Plan will be to admit the patient to the hospital for IV anabiotic's and eventual cholecystectomy.  I discussed this with the patient and her family and she is in agreement with the admission.  Raenell Mensing A 07/28/2014, 9:29 PM

## 2014-07-28 NOTE — ED Notes (Addendum)
Pt reported lt side chest pain/pressure that radiates to back and lt shoulder blade, (+)SHOB, (-)n/v, diaphoresis, and dizziness/lightheadedness. Pt reported for past couple of day she was having midline abd pain and then yesterday the pain was under lt breast area. Pt reported productive cough to greenish sputum.

## 2014-07-28 NOTE — ED Notes (Signed)
Ultrasound at bedside

## 2014-07-29 ENCOUNTER — Observation Stay (HOSPITAL_COMMUNITY): Payer: Medicare Other | Admitting: Certified Registered"

## 2014-07-29 ENCOUNTER — Encounter (HOSPITAL_COMMUNITY)
Admission: EM | Disposition: A | Discharge status: Home / Self Care | Payer: Self-pay | Source: Home / Self Care | Attending: Emergency Medicine

## 2014-07-29 ENCOUNTER — Observation Stay (HOSPITAL_COMMUNITY): Payer: Medicare Other

## 2014-07-29 ENCOUNTER — Encounter (HOSPITAL_COMMUNITY): Payer: Self-pay | Admitting: General Practice

## 2014-07-29 DIAGNOSIS — K8012 Calculus of gallbladder with acute and chronic cholecystitis without obstruction: Secondary | ICD-10-CM | POA: Diagnosis not present

## 2014-07-29 DIAGNOSIS — E780 Pure hypercholesterolemia: Secondary | ICD-10-CM | POA: Diagnosis not present

## 2014-07-29 DIAGNOSIS — Z7982 Long term (current) use of aspirin: Secondary | ICD-10-CM | POA: Diagnosis not present

## 2014-07-29 DIAGNOSIS — M1990 Unspecified osteoarthritis, unspecified site: Secondary | ICD-10-CM | POA: Diagnosis not present

## 2014-07-29 HISTORY — PX: CHOLECYSTECTOMY: SHX55

## 2014-07-29 LAB — CBC
HCT: 39.9 % (ref 36.0–46.0)
Hemoglobin: 13.6 g/dL (ref 12.0–15.0)
MCH: 33.9 pg (ref 26.0–34.0)
MCHC: 34.1 g/dL (ref 30.0–36.0)
MCV: 99.5 fL (ref 78.0–100.0)
Platelets: 218 10*3/uL (ref 150–400)
RBC: 4.01 MIL/uL (ref 3.87–5.11)
RDW: 12.2 % (ref 11.5–15.5)
WBC: 8.3 10*3/uL (ref 4.0–10.5)

## 2014-07-29 LAB — COMPREHENSIVE METABOLIC PANEL
ALT: 25 U/L (ref 14–54)
AST: 38 U/L (ref 15–41)
Albumin: 3.6 g/dL (ref 3.5–5.0)
Alkaline Phosphatase: 65 U/L (ref 38–126)
Anion gap: 12 (ref 5–15)
BUN: 10 mg/dL (ref 6–20)
CO2: 22 mmol/L (ref 22–32)
Calcium: 8.5 mg/dL — ABNORMAL LOW (ref 8.9–10.3)
Chloride: 102 mmol/L (ref 101–111)
Creatinine, Ser: 0.57 mg/dL (ref 0.44–1.00)
GFR calc Af Amer: >60 mL/min (ref >60)
GFR calc non Af Amer: >60 mL/min (ref >60)
Glucose, Bld: 188 mg/dL — ABNORMAL HIGH (ref 65–99)
Potassium: 3.5 mmol/L (ref 3.5–5.1)
Sodium: 136 mmol/L (ref 135–145)
Total Bilirubin: 1.2 mg/dL (ref 0.3–1.2)
Total Protein: 7.3 g/dL (ref 6.5–8.1)

## 2014-07-29 LAB — SURGICAL PCR SCREEN
MRSA, PCR: NEGATIVE
Staphylococcus aureus: NEGATIVE

## 2014-07-29 SURGERY — LAPAROSCOPIC CHOLECYSTECTOMY WITH INTRAOPERATIVE CHOLANGIOGRAM
Anesthesia: General | Site: Abdomen

## 2014-07-29 MED ORDER — BUPIVACAINE-EPINEPHRINE (PF) 0.25% -1:200000 IJ SOLN
INTRAMUSCULAR | Status: AC
  Filled 2014-07-29: qty 30

## 2014-07-29 MED ORDER — SODIUM CHLORIDE 0.9 % IV SOLN
INTRAVENOUS | Status: DC | PRN
  Administered 2014-07-29: 5 mL

## 2014-07-29 MED ORDER — BUPIVACAINE-EPINEPHRINE 0.25% -1:200000 IJ SOLN
INTRAMUSCULAR | Status: DC | PRN
  Administered 2014-07-29: 10 mL

## 2014-07-29 MED ORDER — NEOSTIGMINE METHYLSULFATE 10 MG/10ML IV SOLN
INTRAVENOUS | Status: DC | PRN
  Administered 2014-07-29: 3 mg via INTRAVENOUS

## 2014-07-29 MED ORDER — LACTATED RINGERS IV SOLN
INTRAVENOUS | Status: DC | PRN
  Administered 2014-07-29: 2000 mL

## 2014-07-29 MED ORDER — PROPOFOL 10 MG/ML IV BOLUS
INTRAVENOUS | Status: DC | PRN
  Administered 2014-07-29: 140 mg via INTRAVENOUS

## 2014-07-29 MED ORDER — ROCURONIUM BROMIDE 100 MG/10ML IV SOLN
INTRAVENOUS | Status: DC | PRN
  Administered 2014-07-29: 30 mg via INTRAVENOUS

## 2014-07-29 MED ORDER — MIDAZOLAM HCL 2 MG/2ML IJ SOLN
INTRAMUSCULAR | Status: AC
  Filled 2014-07-29: qty 2

## 2014-07-29 MED ORDER — FENTANYL CITRATE (PF) 100 MCG/2ML IJ SOLN
INTRAMUSCULAR | Status: AC
  Filled 2014-07-29: qty 2

## 2014-07-29 MED ORDER — DEXAMETHASONE SODIUM PHOSPHATE 10 MG/ML IJ SOLN
INTRAMUSCULAR | Status: AC
  Filled 2014-07-29: qty 1

## 2014-07-29 MED ORDER — METRONIDAZOLE 500 MG PO TABS
500.0000 mg | ORAL_TABLET | Freq: Three times a day (TID) | ORAL | Status: DC
  Administered 2014-07-30: 500 mg via ORAL
  Filled 2014-07-29 (×4): qty 1

## 2014-07-29 MED ORDER — 0.9 % SODIUM CHLORIDE (POUR BTL) OPTIME
TOPICAL | Status: DC | PRN
  Administered 2014-07-29: 5 mL

## 2014-07-29 MED ORDER — HYDROMORPHONE HCL 1 MG/ML IJ SOLN
INTRAMUSCULAR | Status: AC
  Filled 2014-07-29: qty 1

## 2014-07-29 MED ORDER — HYDROMORPHONE HCL 1 MG/ML IJ SOLN
0.2500 mg | INTRAMUSCULAR | Status: DC | PRN
  Administered 2014-07-29 (×2): 0.5 mg via INTRAVENOUS

## 2014-07-29 MED ORDER — LIDOCAINE HCL (PF) 2 % IJ SOLN
INTRAMUSCULAR | Status: DC | PRN
  Administered 2014-07-29: 20 mg via INTRADERMAL

## 2014-07-29 MED ORDER — LIDOCAINE HCL (CARDIAC) 20 MG/ML IV SOLN
INTRAVENOUS | Status: AC
  Filled 2014-07-29: qty 5

## 2014-07-29 MED ORDER — LACTATED RINGERS IV SOLN
INTRAVENOUS | Status: AC
  Administered 2014-07-29: 1000 mL via INTRAVENOUS

## 2014-07-29 MED ORDER — GLYCOPYRROLATE 0.2 MG/ML IJ SOLN
INTRAMUSCULAR | Status: DC | PRN
Start: 2014-07-29 — End: 2014-07-29
  Administered 2014-07-29: 0.4 mg via INTRAVENOUS

## 2014-07-29 MED ORDER — ONDANSETRON HCL 4 MG/2ML IJ SOLN
INTRAMUSCULAR | Status: AC
  Filled 2014-07-29: qty 2

## 2014-07-29 MED ORDER — CIPROFLOXACIN HCL 500 MG PO TABS
500.0000 mg | ORAL_TABLET | Freq: Two times a day (BID) | ORAL | Status: DC
  Administered 2014-07-30: 500 mg via ORAL
  Filled 2014-07-29 (×3): qty 1

## 2014-07-29 MED ORDER — ROCURONIUM BROMIDE 100 MG/10ML IV SOLN
INTRAVENOUS | Status: AC
  Filled 2014-07-29: qty 1

## 2014-07-29 MED ORDER — FENTANYL CITRATE (PF) 100 MCG/2ML IJ SOLN
INTRAMUSCULAR | Status: DC | PRN
  Administered 2014-07-29 (×4): 50 ug via INTRAVENOUS

## 2014-07-29 MED ORDER — NEOSTIGMINE METHYLSULFATE 10 MG/10ML IV SOLN
INTRAVENOUS | Status: AC
  Filled 2014-07-29: qty 1

## 2014-07-29 MED ORDER — HYDROMORPHONE HCL 1 MG/ML IJ SOLN
1.0000 mg | Freq: Once | INTRAMUSCULAR | Status: AC
  Administered 2014-07-29: 1 mg via INTRAVENOUS
  Filled 2014-07-29: qty 1

## 2014-07-29 MED ORDER — PIPERACILLIN-TAZOBACTAM 3.375 G IVPB
3.3750 g | Freq: Three times a day (TID) | INTRAVENOUS | Status: AC
  Administered 2014-07-29 (×2): 3.375 g via INTRAVENOUS
  Filled 2014-07-29 (×2): qty 50

## 2014-07-29 MED ORDER — PROPOFOL 10 MG/ML IV BOLUS
INTRAVENOUS | Status: AC
  Filled 2014-07-29: qty 20

## 2014-07-29 MED ORDER — GLYCOPYRROLATE 0.2 MG/ML IJ SOLN
INTRAMUSCULAR | Status: AC
  Filled 2014-07-29: qty 2

## 2014-07-29 MED ORDER — POTASSIUM CHLORIDE IN NACL 20-0.9 MEQ/L-% IV SOLN
INTRAVENOUS | Status: DC
  Administered 2014-07-29 (×2): via INTRAVENOUS
  Filled 2014-07-29 (×2): qty 1000

## 2014-07-29 MED ORDER — DEXAMETHASONE SODIUM PHOSPHATE 10 MG/ML IJ SOLN
INTRAMUSCULAR | Status: DC | PRN
  Administered 2014-07-29: 10 mg via INTRAVENOUS

## 2014-07-29 MED ORDER — SUCCINYLCHOLINE CHLORIDE 20 MG/ML IJ SOLN
INTRAMUSCULAR | Status: DC | PRN
  Administered 2014-07-29: 100 mg via INTRAVENOUS

## 2014-07-29 SURGICAL SUPPLY — 30 items
APPLIER CLIP 5 13 M/L LIGAMAX5 (MISCELLANEOUS) ×2
CABLE HIGH FREQUENCY MONO STRZ (ELECTRODE) ×2 IMPLANT
CHLORAPREP W/TINT 26ML (MISCELLANEOUS) ×2 IMPLANT
CLIP APPLIE 5 13 M/L LIGAMAX5 (MISCELLANEOUS) ×1 IMPLANT
COVER MAYO STAND STRL (DRAPES) ×2 IMPLANT
DRAPE C-ARM 42X120 X-RAY (DRAPES) ×2 IMPLANT
DRAPE LAPAROSCOPIC ABDOMINAL (DRAPES) ×2 IMPLANT
ELECT REM PT RETURN 9FT ADLT (ELECTROSURGICAL) ×2
ELECTRODE REM PT RTRN 9FT ADLT (ELECTROSURGICAL) ×1 IMPLANT
GLOVE BIO SURGEON STRL SZ 6.5 (GLOVE) ×2 IMPLANT
GLOVE BIOGEL PI IND STRL 7.0 (GLOVE) ×1 IMPLANT
GLOVE BIOGEL PI INDICATOR 7.0 (GLOVE) ×1
GOWN STRL REUS W/TWL 2XL LVL3 (GOWN DISPOSABLE) ×2 IMPLANT
GOWN STRL REUS W/TWL XL LVL3 (GOWN DISPOSABLE) ×4 IMPLANT
KIT BASIN OR (CUSTOM PROCEDURE TRAY) ×2 IMPLANT
LIQUID BAND (GAUZE/BANDAGES/DRESSINGS) ×2 IMPLANT
POUCH SPECIMEN RETRIEVAL 10MM (ENDOMECHANICALS) ×2 IMPLANT
SCISSORS LAP 5X35 DISP (ENDOMECHANICALS) ×2 IMPLANT
SET CHOLANGIOGRAPH MIX (MISCELLANEOUS) IMPLANT
SET IRRIG TUBING LAPAROSCOPIC (IRRIGATION / IRRIGATOR) ×2 IMPLANT
SUT VIC AB 2-0 SH 27 (SUTURE) ×1
SUT VIC AB 2-0 SH 27X BRD (SUTURE) ×1 IMPLANT
SUT VIC AB 4-0 PS2 18 (SUTURE) ×2 IMPLANT
SUT VICRYL 0 UR6 27IN ABS (SUTURE) ×2 IMPLANT
TOWEL OR 17X26 10 PK STRL BLUE (TOWEL DISPOSABLE) ×2 IMPLANT
TOWEL OR NON WOVEN STRL DISP B (DISPOSABLE) ×2 IMPLANT
TRAY LAPAROSCOPIC (CUSTOM PROCEDURE TRAY) ×2 IMPLANT
TROCAR BLADELESS OPT 5 75 (ENDOMECHANICALS) ×2 IMPLANT
TROCAR SLEEVE XCEL 5X75 (ENDOMECHANICALS) ×4 IMPLANT
TROCAR XCEL BLUNT TIP 100MML (ENDOMECHANICALS) ×2 IMPLANT

## 2014-07-29 NOTE — Anesthesia Preprocedure Evaluation (Signed)
Anesthesia Evaluation  Patient identified by MRN, date of birth, ID band Patient awake    Reviewed: Allergy & Precautions, NPO status , Patient's Chart, lab work & pertinent test results  Airway Mallampati: II  TM Distance: >3 FB Neck ROM: Full    Dental no notable dental hx.    Pulmonary neg pulmonary ROS,  breath sounds clear to auscultation  Pulmonary exam normal       Cardiovascular negative cardio ROS Normal cardiovascular examRhythm:Regular Rate:Normal     Neuro/Psych negative neurological ROS  negative psych ROS   GI/Hepatic negative GI ROS, Neg liver ROS,   Endo/Other  negative endocrine ROS  Renal/GU negative Renal ROS  negative genitourinary   Musculoskeletal  (+) Arthritis -, Osteoarthritis,    Abdominal   Peds negative pediatric ROS (+)  Hematology negative hematology ROS (+)   Anesthesia Other Findings   Reproductive/Obstetrics negative OB ROS                             Anesthesia Physical Anesthesia Plan  ASA: II  Anesthesia Plan: General   Post-op Pain Management:    Induction: Intravenous  Airway Management Planned: Oral ETT  Additional Equipment:   Intra-op Plan:   Post-operative Plan: Extubation in OR  Informed Consent: I have reviewed the patients History and Physical, chart, labs and discussed the procedure including the risks, benefits and alternatives for the proposed anesthesia with the patient or authorized representative who has indicated his/her understanding and acceptance.   Dental advisory given  Plan Discussed with: CRNA  Anesthesia Plan Comments:         Anesthesia Quick Evaluation

## 2014-07-29 NOTE — Op Note (Signed)
07/28/2014 - 07/29/2014  10:27 AM  PATIENT:  Sarah Benitez  74 y.o. female  Patient Care Team: Renford Dillsonald Polite, MD as PCP - General (Internal Medicine)  PRE-OPERATIVE DIAGNOSIS:  cholecystitis  POST-OPERATIVE DIAGNOSIS:  cholecystitis  PROCEDURE:  LAPAROSCOPIC CHOLECYSTECTOMY WITH INTRAOPERATIVE CHOLANGIOGRAM  Surgeon(s): Romie LeveeAlicia Eason Housman, MD  ASSISTANT: none   ANESTHESIA:   local and general  EBL:     DRAINS: none   SPECIMEN:  Source of Specimen:  gallbladder  DISPOSITION OF SPECIMEN:  PATHOLOGY  COUNTS:  YES  PLAN OF CARE: patient admitted  PATIENT DISPOSITION:  PACU - hemodynamically stable.  INDICATION: 74 y.o. F with signs and symptoms of cholecystitis.  I recommended cholecystectomy with IOC. Risks such as bleeding, infection, abscess, leak, injury to other organs, need for further treatment, heart attack, death, and other risks were discussed.  I noted a good likelihood this will help address the problem.  Possibility that this will not correct all abdominal symptoms was explained.  Goals of post-operative recovery were discussed as well.     OR FINDINGS: Normal cholangiogram, inflamed and gallbladder with evidence of early necrosis   DESCRIPTION:   The patient was identified & brought into the operating room. The patient was positioned supine with arms tucked. SCDs were active during the entire case. The patient underwent general anesthesia without any difficulty.  The abdomen was prepped and draped in a sterile fashion. A Surgical Timeout was performed and confirmed our plan.  We positioned the patient in reverse [place] & right side up.  I placed a Hassan laparoscopic port through the umbilicus using open entry technique.  Entry was clean. There were no adhesions to the anterior abdominal wall supraumbilically.  We induced carbon dioxide insufflation. Camera inspection revealed no injury.    I proceeded to continue with laparoscopic technique. I placed a 5 mm  port in mid subcostal region, another 5mm port in the right flank near the anterior axillary line, and a 5mm port in the left subxiphoid region obliquely within the falciform ligament.  I turned attention to the right upper quadrant.  The gallbladder was aspirated and thick, dark bile was returned.  The gallbladder fundus was elevated cephalad. I used cautery and blunt dissection to free the peritoneal coverings between the gallbladder and the liver on the posteriolateral and anteriomedial walls.   I used careful blunt and cautery dissection to help get a good critical view of the cystic artery and cystic duct. I did further dissection to free a few centimeters of the  gallbladder off the liver bed to get a good critical view of the infundibulum and cystic duct. I mobilized the cystic artery.  I skeletonized the cystic duct.  After getting a good 360 view, I decided to perform a cholangiogram.  I placed a clip on the infundibulum.   I did a partial cystic duct-otomy and ensured patency. I placed a 5 F cholangiocatheter through a puncture site at the right subcostal ridge of the abdominal wall and directed it into the cystic duct.  This was secured with a clip. We ran a cholangiogram with dilute radio-opaque contrast and continuous fluoroscopy.  Contrast flowed from a side branch consistent with cystic duct cannulization. Contrast flowed up the common hepatic duct into the right and left intrahepatic chains out to secondary radicals. Contrast flowed down the common bile duct easily across the normal ampulla into the duodenum.  This was consistent with a normal cholangiogram.  I removed the cholangiocatheter.  I placed clips  on the cystic duct x3.  I completed cystic duct transection.   I placed clips on the cystic artery x3 with 2 proximally.  I ligated the cystic artery using scissors. I freed the gallbladder from its remaining attachments to the liver. I ensured hemostasis on the gallbladder fossa of the  liver and elsewhere. I inspected the rest of the abdomen & detected no injury nor bleeding elsewhere.  I irrigated the RUQ with normal saline.  I removed the gallbladder through the umbilical port site.  I closed the umbilical fascia using 0 Vicryl stitches.   I closed the skin using 4-0 vicryl stitch.  Sterile dressings were applied. The patient was extubated & arrived in the PACU in stable condition.  I had discussed postoperative care with the patient in the holding area.   I will discuss  operative findings and postoperative goals / instructions with the patient's family.  Instructions are written in the chart as well.

## 2014-07-29 NOTE — Anesthesia Procedure Notes (Signed)
Procedure Name: Intubation Date/Time: 07/29/2014 8:56 AM Performed by: Early OsmondEARGLE, Tuyet Bader E Pre-anesthesia Checklist: Patient identified, Emergency Drugs available, Suction available and Patient being monitored Patient Re-evaluated:Patient Re-evaluated prior to inductionOxygen Delivery Method: Circle System Utilized Preoxygenation: Pre-oxygenation with 100% oxygen Intubation Type: IV induction Ventilation: Mask ventilation without difficulty Laryngoscope Size: Miller and 2 Grade View: Grade I Tube type: Oral Tube size: 7.0 mm Number of attempts: 1 Airway Equipment and Method: Stylet Placement Confirmation: ETT inserted through vocal cords under direct vision,  positive ETCO2 and breath sounds checked- equal and bilateral Secured at: 21 cm Tube secured with: Tape Dental Injury: Teeth and Oropharynx as per pre-operative assessment

## 2014-07-29 NOTE — Anesthesia Postprocedure Evaluation (Signed)
  Anesthesia Post-op Note  Patient: Sarah Benitez  Procedure(s) Performed: Procedure(s) (LRB): LAPAROSCOPIC CHOLECYSTECTOMY WITH INTRAOPERATIVE CHOLANGIOGRAM (N/A)  Patient Location: PACU  Anesthesia Type: general  Level of Consciousness: awake and alert   Airway and Oxygen Therapy: Patient Spontanous Breathing  Post-op Pain: mild  Post-op Assessment: Post-op Vital signs reviewed, Patient's Cardiovascular Status Stable, Respiratory Function Stable, Patent Airway and No signs of Nausea or vomiting  Last Vitals:  Filed Vitals:   07/29/14 1150  BP: 145/71  Pulse: 86  Temp: 36.6 C  Resp: 16    Post-op Vital Signs: stable   Complications: No apparent anesthesia complications

## 2014-07-29 NOTE — Progress Notes (Signed)
Subjective: Pt with nausea and vomiting overnight  Objective: Vital signs in last 24 hours: Temp:  [97.6 F (36.4 C)-98.8 F (37.1 C)] 97.6 F (36.4 C) (06/27 0457) Pulse Rate:  [71-88] 88 (06/27 0457) Resp:  [16-25] 16 (06/27 0457) BP: (144-179)/(59-81) 144/59 mmHg (06/27 0457) SpO2:  [93 %-98 %] 93 % (06/27 0457) Weight:  [84.1 kg (185 lb 6.5 oz)] 84.1 kg (185 lb 6.5 oz) (06/26 2314) Last BM Date: 07/28/14  Intake/Output from previous day: 06/26 0701 - 06/27 0700 In: 575 [I.V.:475; IV Piggyback:100] Out: 400 [Urine:400] Intake/Output this shift:    General appearance: alert and cooperative Resp: clear to auscultation bilaterally Cardio: regular rate and rhythm GI: normal findings: soft, non-tender  Lab Results:  Results for orders placed or performed during the hospital encounter of 07/28/14 (from the past 24 hour(s))  CBC     Status: None   Collection Time: 07/28/14  4:05 PM  Result Value Ref Range   WBC 7.9 4.0 - 10.5 K/uL   RBC 3.94 3.87 - 5.11 MIL/uL   Hemoglobin 13.3 12.0 - 15.0 g/dL   HCT 16.139.4 09.636.0 - 04.546.0 %   MCV 100.0 78.0 - 100.0 fL   MCH 33.8 26.0 - 34.0 pg   MCHC 33.8 30.0 - 36.0 g/dL   RDW 40.912.3 81.111.5 - 91.415.5 %   Platelets 228 150 - 400 K/uL  Comprehensive metabolic panel     Status: Abnormal   Collection Time: 07/28/14  4:05 PM  Result Value Ref Range   Sodium 141 135 - 145 mmol/L   Potassium 3.6 3.5 - 5.1 mmol/L   Chloride 108 101 - 111 mmol/L   CO2 25 22 - 32 mmol/L   Glucose, Bld 125 (H) 65 - 99 mg/dL   BUN 15 6 - 20 mg/dL   Creatinine, Ser 7.820.69 0.44 - 1.00 mg/dL   Calcium 9.0 8.9 - 95.610.3 mg/dL   Total Protein 7.2 6.5 - 8.1 g/dL   Albumin 3.8 3.5 - 5.0 g/dL   AST 30 15 - 41 U/L   ALT 17 14 - 54 U/L   Alkaline Phosphatase 64 38 - 126 U/L   Total Bilirubin 1.1 0.3 - 1.2 mg/dL   GFR calc non Af Amer >60 >60 mL/min   GFR calc Af Amer >60 >60 mL/min   Anion gap 8 5 - 15  Lipase, blood     Status: None   Collection Time: 07/28/14  4:05 PM   Result Value Ref Range   Lipase 22 22 - 51 U/L  [facility]stat troponin, ED  [facility]     Status: None   Collection Time: 07/28/14  4:18 PM  Result Value Ref Range   Troponin i, poc 0.00 0.00 - 0.08 ng/mL   Comment 3          Surgical pcr screen     Status: None   Collection Time: 07/28/14 11:31 PM  Result Value Ref Range   MRSA, PCR NEGATIVE NEGATIVE   Staphylococcus aureus NEGATIVE NEGATIVE  Comprehensive metabolic panel     Status: Abnormal   Collection Time: 07/29/14  4:50 AM  Result Value Ref Range   Sodium 136 135 - 145 mmol/L   Potassium 3.5 3.5 - 5.1 mmol/L   Chloride 102 101 - 111 mmol/L   CO2 22 22 - 32 mmol/L   Glucose, Bld 188 (H) 65 - 99 mg/dL   BUN 10 6 - 20 mg/dL   Creatinine, Ser 2.130.57 0.44 - 1.00  mg/dL   Calcium 8.5 (L) 8.9 - 10.3 mg/dL   Total Protein 7.3 6.5 - 8.1 g/dL   Albumin 3.6 3.5 - 5.0 g/dL   AST 38 15 - 41 U/L   ALT 25 14 - 54 U/L   Alkaline Phosphatase 65 38 - 126 U/L   Total Bilirubin 1.2 0.3 - 1.2 mg/dL   GFR calc non Af Amer >60 >60 mL/min   GFR calc Af Amer >60 >60 mL/min   Anion gap 12 5 - 15  CBC     Status: None   Collection Time: 07/29/14  4:50 AM  Result Value Ref Range   WBC 8.3 4.0 - 10.5 K/uL   RBC 4.01 3.87 - 5.11 MIL/uL   Hemoglobin 13.6 12.0 - 15.0 g/dL   HCT 78.4 69.6 - 29.5 %   MCV 99.5 78.0 - 100.0 fL   MCH 33.9 26.0 - 34.0 pg   MCHC 34.1 30.0 - 36.0 g/dL   RDW 28.4 13.2 - 44.0 %   Platelets 218 150 - 400 K/uL     Studies/Results Radiology     MEDS, Scheduled . enoxaparin (LOVENOX) injection  40 mg Subcutaneous QHS  . levothyroxine  112 mcg Oral QAC breakfast  . nabumetone  500 mg Oral Daily  . piperacillin-tazobactam (ZOSYN)  IV  3.375 g Intravenous 3 times per day     Assessment: <principal problem not specified> Cholecystitis   Plan: The anatomy & physiology of hepatobiliary & pancreatic function was discussed.  The pathophysiology of gallbladder dysfunction was discussed.  Natural history risks  without surgery was discussed.   I feel the risks of no intervention will lead to serious problems that outweigh the operative risks; therefore, I recommended cholecystectomy to remove the pathology.  I explained laparoscopic techniques with possible need for an open approach.  Probable cholangiogram to evaluate the bilary tract was explained as well.    Risks such as bleeding, infection, abscess, leak, injury to other organs, bile duct injury, need for further treatment, heart attack, death, and other risks were discussed.  I noted a good likelihood this will help address the problem.  Possibility that this will not correct all abdominal symptoms was explained.  Goals of post-operative recovery were discussed as well.  We will work to minimize complications.  An educational handout further explaining the pathology and treatment options was given as well.  Questions were answered.  The patient expresses understanding & wishes to proceed with surgery.      Vanita Panda, MD [facility], [place] [phone]   07/29/2014 7:30 AM

## 2014-07-29 NOTE — Progress Notes (Signed)
Consent signed by pt and this nurse. Pt has no more questions. Consent given to OR nurse who is transporting pt down to OR.

## 2014-07-29 NOTE — Transfer of Care (Signed)
Immediate Anesthesia Transfer of Care Note  Patient: Sarah Benitez  Procedure(s) Performed: Procedure(s): LAPAROSCOPIC CHOLECYSTECTOMY WITH INTRAOPERATIVE CHOLANGIOGRAM (N/A)  Patient Location: PACU  Anesthesia Type:General  Level of Consciousness:  sedated, patient cooperative and responds to stimulation  Airway & Oxygen Therapy:Patient Spontanous Breathing and Patient connected to face mask oxgen  Post-op Assessment:  Report given to PACU RN and Post -op Vital signs reviewed and stable  Post vital signs:  Reviewed and stable  Last Vitals:  Filed Vitals:   07/29/14 0457  BP: 144/59  Pulse: 88  Temp: 36.4 C  Resp: 16    Complications: No apparent anesthesia complications

## 2014-07-30 ENCOUNTER — Encounter (HOSPITAL_COMMUNITY): Payer: Self-pay | Admitting: General Surgery

## 2014-07-30 MED ORDER — CIPROFLOXACIN HCL 500 MG PO TABS: 500.0000 mg | ORAL_TABLET | Freq: Two times a day (BID) | ORAL | Status: DC

## 2014-07-30 MED ORDER — HYDROCODONE-ACETAMINOPHEN 5-325 MG PO TABS: 1.0000 | ORAL_TABLET | ORAL | Status: DC | PRN

## 2014-07-30 NOTE — Progress Notes (Signed)
Discharge instructions given along with prescriptions, questions answered

## 2014-07-30 NOTE — Discharge Instructions (Signed)
[facility] ______[facility] °LAPAROSCOPIC SURGERY: POST OP INSTRUCTIONS °Always review your discharge instruction sheet given to you by the facility where your surgery was performed. °IF YOU HAVE DISABILITY OR FAMILY LEAVE FORMS, YOU MUST BRING THEM TO THE OFFICE FOR PROCESSING.   °DO NOT GIVE THEM TO YOUR DOCTOR. ° °1. A prescription for pain medication may be given to you upon discharge.  Take your pain medication as prescribed, if needed.  If narcotic pain medicine is not needed, then you may take acetaminophen (Tylenol) or ibuprofen (Advil) as needed. °2. Take your usually prescribed medications unless otherwise directed. °3. If you need a refill on your pain medication, please contact your pharmacy.  They will contact our office to request authorization. Prescriptions will not be filled after 5pm or on week-ends. °4. You should follow a light diet the first few days after arrival home, such as soup and crackers, etc.  Be sure to include lots of fluids daily. °5. Most patients will experience some swelling and bruising in the area of the incisions.  Ice packs will help.  Swelling and bruising can take several days to resolve.  °6. It is common to experience some constipation if taking pain medication after surgery.  Increasing fluid intake and taking a stool softener (such as Colace) will usually help or prevent this problem from occurring.  A mild laxative (Milk of Magnesia or Miralax) should be taken according to package instructions if there are no bowel movements after 48 hours. °7. Unless discharge instructions indicate otherwise, you may remove your bandages 24-48 hours after surgery, and you may shower at that time.  You may have steri-strips (small skin tapes) in place directly over the incision.  These strips should be left on the skin for 7-10 days.  If your surgeon used skin glue on the incision, you may shower in 24 hours.  The glue will flake off over the next 2-3 weeks.  Any sutures or  staples will be removed at the office during your follow-up visit. °8. ACTIVITIES:  You may resume regular (light) daily activities beginning the next day--such as daily self-care, walking, climbing stairs--gradually increasing activities as tolerated.  You may have sexual intercourse when it is comfortable.  Refrain from any heavy lifting or straining until approved by your doctor. °a. You may drive when you are no longer taking prescription pain medication, you can comfortably wear a seatbelt, and you can safely maneuver your car and apply brakes. °b. RETURN TO WORK:  __________________________________________________________ °9. You should see your doctor in the office for a follow-up appointment approximately 2-3 weeks after your surgery.  Make sure that you call for this appointment within a day or two after you arrive home to insure a convenient appointment time. °10. OTHER INSTRUCTIONS: __________________________________________________________________________________________________________________________ __________________________________________________________________________________________________________________________ °WHEN TO CALL YOUR DOCTOR: °1. Fever over 101.0 °2. Inability to urinate °3. Continued bleeding from incision. °4. Increased pain, redness, or drainage from the incision. °5. Increasing abdominal pain ° °The clinic staff is available to answer your questions during regular business hours.  Please don’t hesitate to call and ask to speak to one of the nurses for clinical concerns.  If you have a medical emergency, go to the nearest emergency room or call 911.  A surgeon from [facility] is always on call at the hospital. °[address], [address], [place], [place]  [address] ? [address] [address], [place], [place]   [address] °[phone] ? [phone] ? FAX [phone] °Web site:   [email or web address] °

## 2014-07-30 NOTE — Discharge Summary (Signed)
Patient ID: Sarah Benitez MRN: [id number] DOB/AGE: 76/07/1940 76 y.o.  Admit date: 07/28/2014 Discharge date: 07/30/2014  Procedures: lap chole with IOC  Consults: None  Reason for Admission: This pleasant female presents with a 2 day history of right upper quadrant abdominal pain hurting through to her back. This occurred after a fatty meal and then got worse today after another fatty meal. The pain is sharp and severe. She has no nausea or vomiting. She has had no similar attacks of pain. She is otherwise without complaints.  Admission Diagnoses:  1. Acute cholecystitis  Hospital Course: The patient was admitted and taken to the OR where she underwent a lap chole with IOC.  She tolerated this well.  On POD 1, she was tolerating a regular diet and having good pain control.  She was stable for dc home.  PE: Abd: soft, appropriately tender, +BS, ND, incisions c/d/i with dermabond  Discharge Diagnoses:  Active Problems:   Acute cholecystitis s/p lap chole  Discharge Medications:   Medication List    TAKE these medications        acetaminophen 500 MG tablet  Commonly known as:  TYLENOL  Take 1,000 mg by mouth 2 (two) times daily.     aspirin 81 MG tablet  Take 81 mg by mouth at bedtime.     CALTRATE 600 PO  Take 1 tablet by mouth at bedtime.     cephALEXin 500 MG capsule  Commonly known as:  KEFLEX  Take 1 capsule (500 mg total) by mouth 2 (two) times daily.     ciprofloxacin 500 MG tablet  Commonly known as:  CIPRO  Take 1 tablet (500 mg total) by mouth 2 (two) times daily.     GINKOBA PO  Take 1 tablet by mouth daily.     GLUCOSAMINE CHONDR COMPLEX PO  Take 1 tablet by mouth daily.     HYDROcodone-acetaminophen 5-325 MG per tablet  Commonly known as:  NORCO/VICODIN  Take 1-2 tablets by mouth every 4 (four) hours as needed for moderate pain.     levothyroxine 112 MCG tablet  Commonly known as:  SYNTHROID, LEVOTHROID  Take 112 mcg by mouth daily before  breakfast.     multivitamin capsule  Take 1 capsule by mouth at bedtime.     nabumetone 500 MG tablet  Commonly known as:  RELAFEN  Take 500 mg by mouth daily.     nystatin 100000 UNIT/GM Powd  Apply 1 Bottle topically 2 (two) times daily as needed (rash).     OMEGA-3 FISH OIL PO  Take 1 capsule by mouth daily.     simvastatin 20 MG tablet  Commonly known as:  ZOCOR  Take 20 mg by mouth at bedtime.     ST JOHNS WORT EXTRACT PO  Take 1 tablet by mouth daily.        Discharge Instructions: Follow-up Information    [facility]2014.   Specialty:  General Surgery   Why:  [facility], 2:45pm, arrive no later than 2:15pm, for paperwork   Contact information:   [address] [place] [address] [phone]       Signed: Letha CapeOSBORNE,Bonnie Overdorf E 07/30/2014, 12:00 PM

## 2015-05-21 DIAGNOSIS — H5213 Myopia, bilateral: Secondary | ICD-10-CM | POA: Diagnosis not present

## 2015-07-03 DIAGNOSIS — E7800 Pure hypercholesterolemia, unspecified: Secondary | ICD-10-CM | POA: Diagnosis not present

## 2015-07-03 DIAGNOSIS — R195 Other fecal abnormalities: Secondary | ICD-10-CM | POA: Diagnosis not present

## 2015-07-03 DIAGNOSIS — Z0000 Encounter for general adult medical examination without abnormal findings: Secondary | ICD-10-CM | POA: Diagnosis not present

## 2015-07-03 DIAGNOSIS — Z6835 Body mass index (BMI) 35.0-35.9, adult: Secondary | ICD-10-CM | POA: Diagnosis not present

## 2015-07-03 DIAGNOSIS — Z1389 Encounter for screening for other disorder: Secondary | ICD-10-CM | POA: Diagnosis not present

## 2015-07-03 DIAGNOSIS — E039 Hypothyroidism, unspecified: Secondary | ICD-10-CM | POA: Diagnosis not present

## 2015-07-03 DIAGNOSIS — E663 Overweight: Secondary | ICD-10-CM | POA: Diagnosis not present

## 2015-07-10 DIAGNOSIS — Z1211 Encounter for screening for malignant neoplasm of colon: Secondary | ICD-10-CM | POA: Diagnosis not present

## 2015-10-22 DIAGNOSIS — Z23 Encounter for immunization: Secondary | ICD-10-CM | POA: Diagnosis not present

## 2015-11-12 IMAGING — US US ABDOMEN COMPLETE
1 series · 13 of 25 positions shown · non-contrast
Comparison: None.

CLINICAL DATA: Right upper quadrant pain. Back pain under the
scapula.

EXAM:
ULTRASOUND ABDOMEN COMPLETE

[Series 1: us abdomen complete · 0.27mm/px · 13 of 92 slices shown]
[im 1/92]
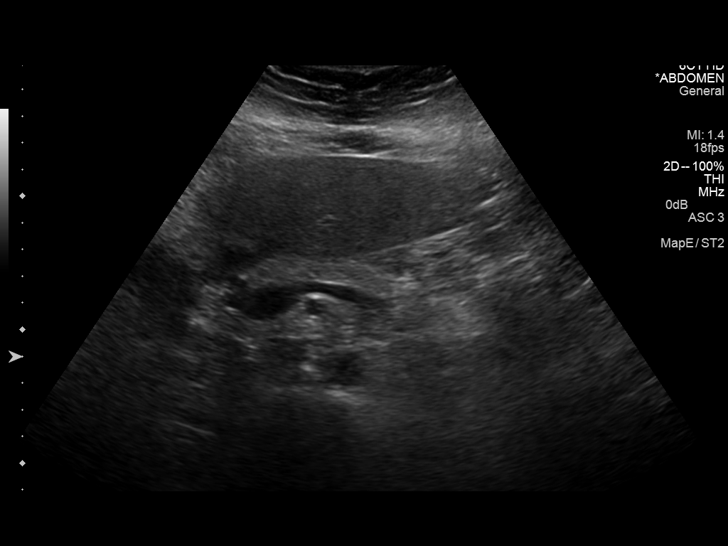
[im 8/92]
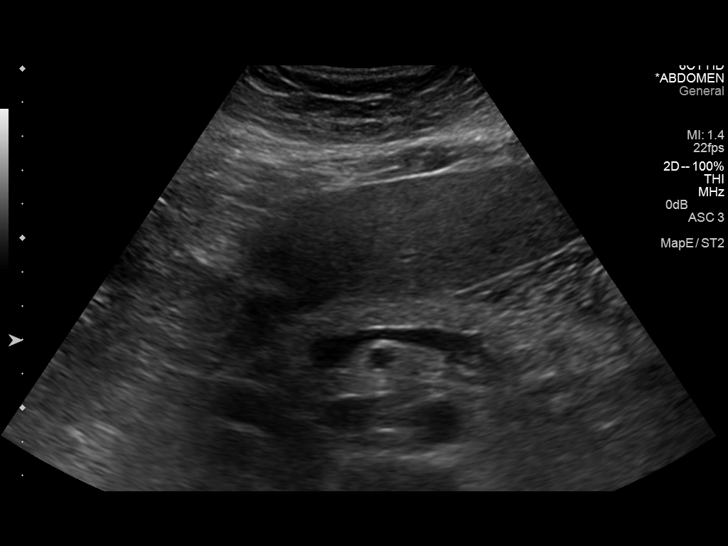
[im 16/92]
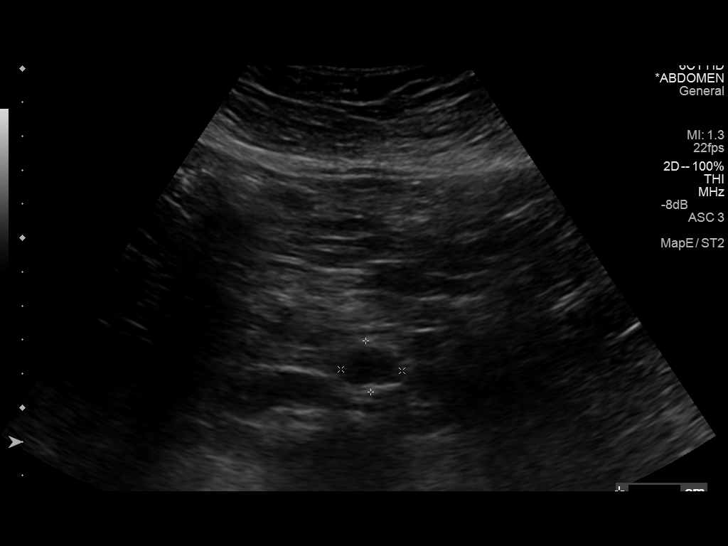
[im 23/92]
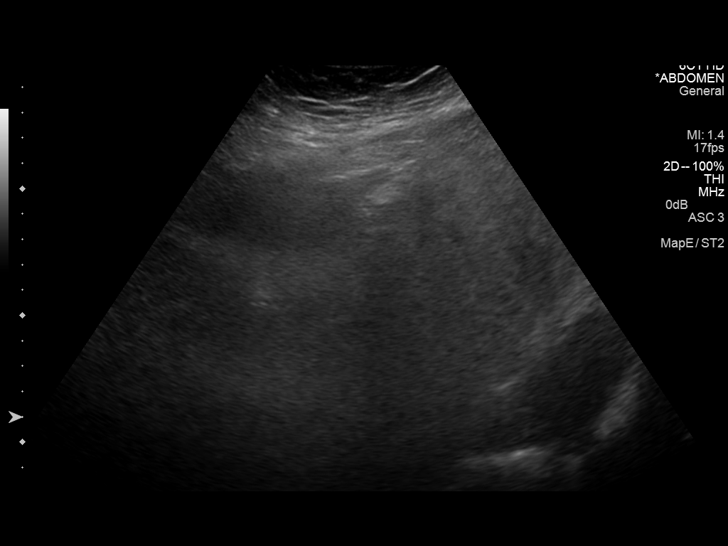
[im 31/92]
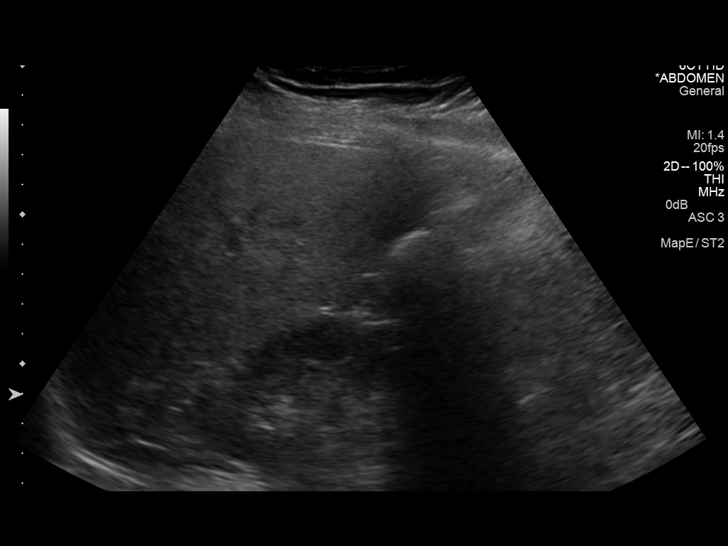
[im 38/92]
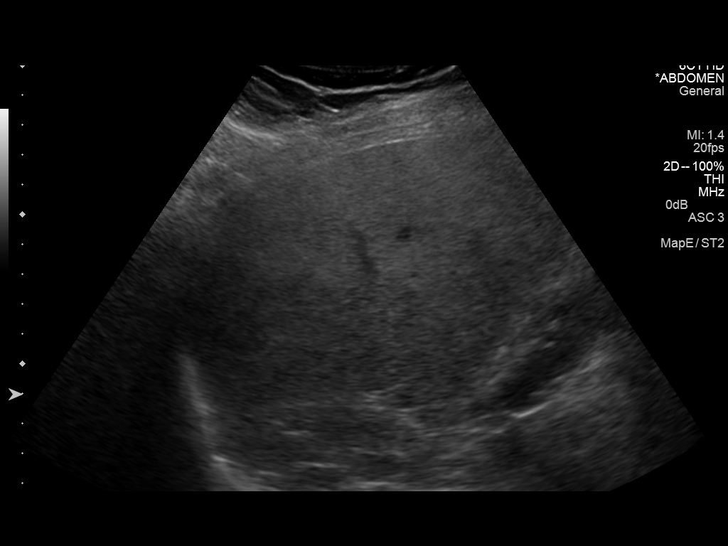
[im 46/92]
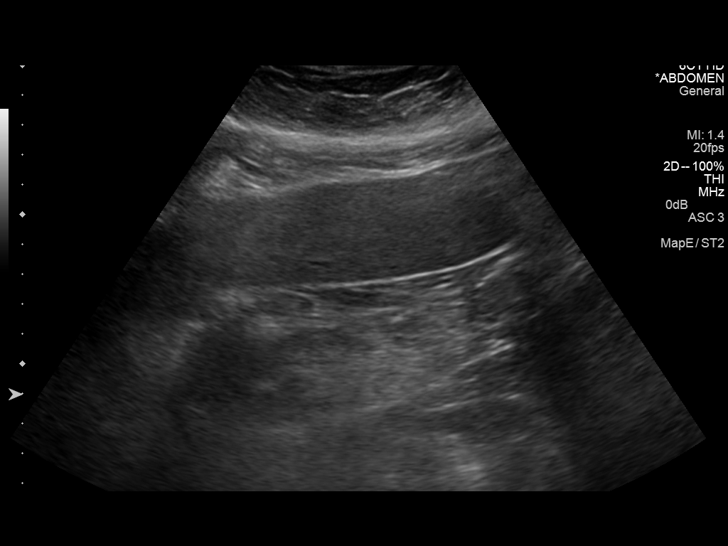
[im 54/92]
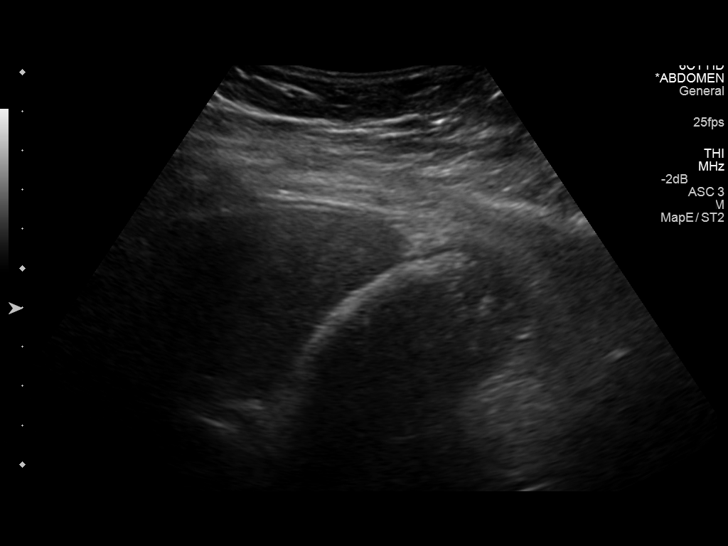
[im 61/92]
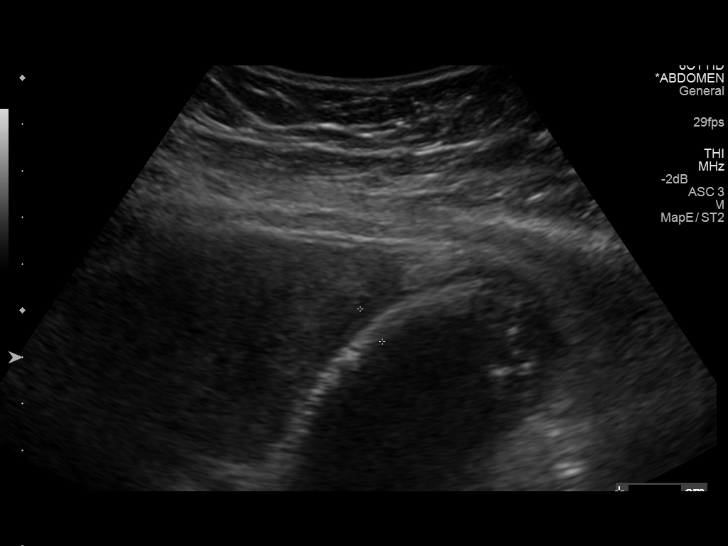
[im 69/92]
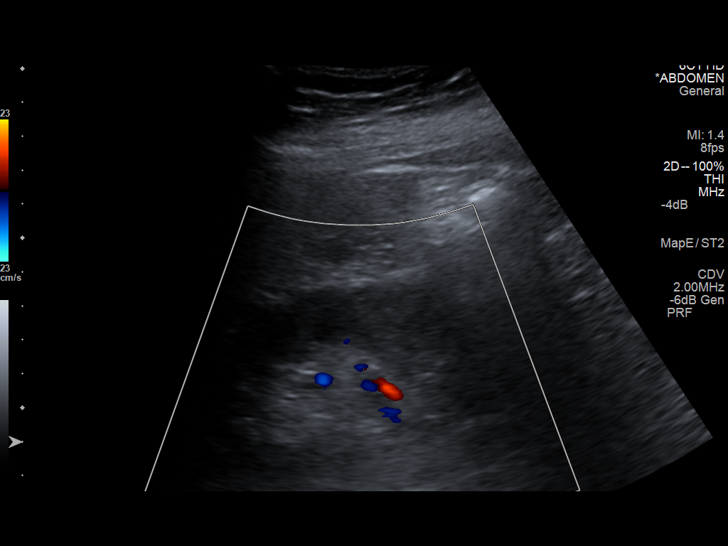
[im 76/92]
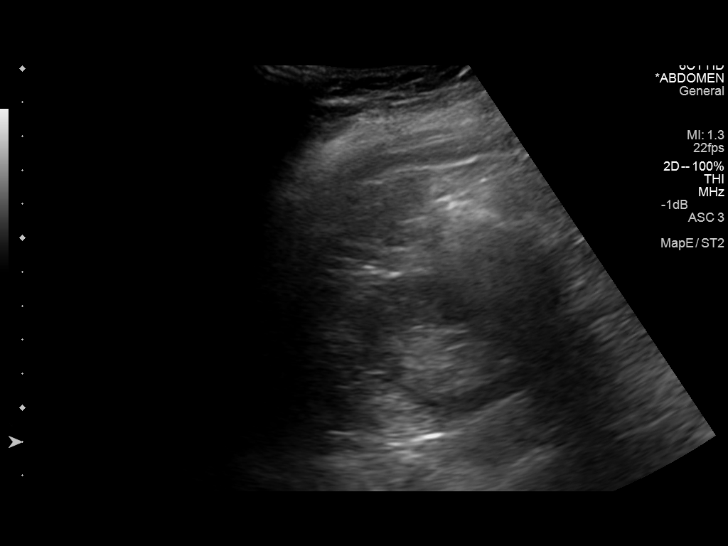
[im 84/92]
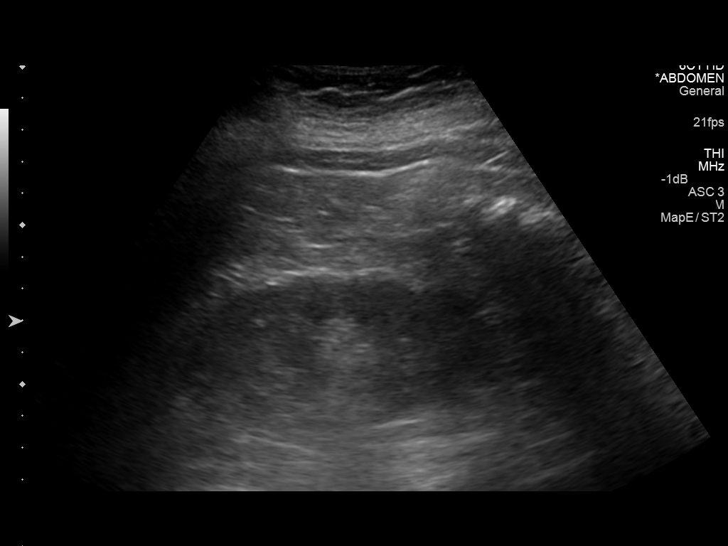
[im 92/92]
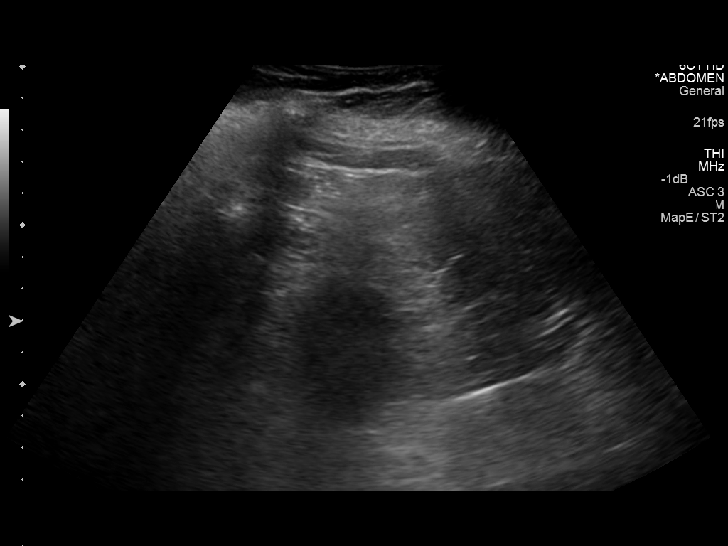

[13 of 25 positions shown; findings below may reference images not displayed]

FINDINGS: Gallbladder: Shadowing for the gallbladder wall may be related to
diffuse wall calcification versus stone filled gallbladder. There is
a discrete 1.9 cm stone noted at the gallbladder neck with
additional stones and sludge within the gallbladder lumen.
Gallbladder wall appears thickened measuring 8 mm. There is
questionable small amount of pericholecystic fluid. Sonographic
Murphy sign is positive.

Common bile duct: Diameter: 1.3 cm proximally, 5 mm distally.

Liver: No focal lesion identified. Within normal limits in
parenchymal echogenicity. There is normal directional flow in the
main portal vein.

IVC: No abnormality visualized.

Pancreas: Visualized portion unremarkable. Normal caliber pancreatic
duct rib visualize measuring 2 mm.

Spleen: Size and appearance within normal limits.

Right Kidney: Length: 11.7 cm. Echogenicity within normal limits. No
mass or hydronephrosis visualized.

Left Kidney: Length: 10.8 cm. Echogenicity within normal limits. No
mass or hydronephrosis visualized.

Abdominal aorta: No aneurysm visualized.

Other findings: None.
IMPRESSION: 1. Constellation of findings consistent with acute cholecystitis.
Gallbladder filled with echogenic stones and sludge, with a stone at
the gallbladder neck. Possible gallbladder wall
calcifications/porcelain gallbladder and small amount of
pericholecystic fluid.
2. Dilated proximal common bile duct measuring 1.3 cm, with normal
caliber distal common bile duct measuring 5 mm. This may reflect a
variant of Sharlyn syndrome.

## 2015-12-08 DIAGNOSIS — Z1231 Encounter for screening mammogram for malignant neoplasm of breast: Secondary | ICD-10-CM | POA: Diagnosis not present

## 2016-01-02 DIAGNOSIS — E039 Hypothyroidism, unspecified: Secondary | ICD-10-CM | POA: Diagnosis not present

## 2016-01-02 DIAGNOSIS — E663 Overweight: Secondary | ICD-10-CM | POA: Diagnosis not present

## 2016-01-02 DIAGNOSIS — E7800 Pure hypercholesterolemia, unspecified: Secondary | ICD-10-CM | POA: Diagnosis not present

## 2016-01-02 DIAGNOSIS — Z6834 Body mass index (BMI) 34.0-34.9, adult: Secondary | ICD-10-CM | POA: Diagnosis not present

## 2016-05-20 DIAGNOSIS — H524 Presbyopia: Secondary | ICD-10-CM | POA: Diagnosis not present

## 2016-05-28 DIAGNOSIS — H04123 Dry eye syndrome of bilateral lacrimal glands: Secondary | ICD-10-CM | POA: Diagnosis not present

## 2016-05-28 DIAGNOSIS — H2513 Age-related nuclear cataract, bilateral: Secondary | ICD-10-CM | POA: Diagnosis not present

## 2016-06-18 DIAGNOSIS — H04123 Dry eye syndrome of bilateral lacrimal glands: Secondary | ICD-10-CM | POA: Diagnosis not present

## 2016-07-23 DIAGNOSIS — Z1389 Encounter for screening for other disorder: Secondary | ICD-10-CM | POA: Diagnosis not present

## 2016-07-23 DIAGNOSIS — Z0000 Encounter for general adult medical examination without abnormal findings: Secondary | ICD-10-CM | POA: Diagnosis not present

## 2016-07-23 DIAGNOSIS — E7800 Pure hypercholesterolemia, unspecified: Secondary | ICD-10-CM | POA: Diagnosis not present

## 2016-07-23 DIAGNOSIS — E663 Overweight: Secondary | ICD-10-CM | POA: Diagnosis not present

## 2016-07-28 DIAGNOSIS — M180 Bilateral primary osteoarthritis of first carpometacarpal joints: Secondary | ICD-10-CM | POA: Insufficient documentation

## 2016-07-28 DIAGNOSIS — M65342 Trigger finger, left ring finger: Secondary | ICD-10-CM | POA: Diagnosis not present

## 2016-07-28 DIAGNOSIS — M79644 Pain in right finger(s): Secondary | ICD-10-CM | POA: Diagnosis not present

## 2016-07-28 DIAGNOSIS — M25649 Stiffness of unspecified hand, not elsewhere classified: Secondary | ICD-10-CM | POA: Diagnosis not present

## 2016-07-28 DIAGNOSIS — M65332 Trigger finger, left middle finger: Secondary | ICD-10-CM | POA: Insufficient documentation

## 2016-07-28 DIAGNOSIS — M19042 Primary osteoarthritis, left hand: Secondary | ICD-10-CM | POA: Diagnosis not present

## 2016-07-28 DIAGNOSIS — M19041 Primary osteoarthritis, right hand: Secondary | ICD-10-CM | POA: Diagnosis not present

## 2016-07-28 DIAGNOSIS — M65311 Trigger thumb, right thumb: Secondary | ICD-10-CM | POA: Diagnosis not present

## 2016-07-30 DIAGNOSIS — H10413 Chronic giant papillary conjunctivitis, bilateral: Secondary | ICD-10-CM | POA: Diagnosis not present

## 2016-08-31 DIAGNOSIS — H10413 Chronic giant papillary conjunctivitis, bilateral: Secondary | ICD-10-CM | POA: Diagnosis not present

## 2016-10-06 DIAGNOSIS — H2512 Age-related nuclear cataract, left eye: Secondary | ICD-10-CM | POA: Diagnosis not present

## 2016-10-06 DIAGNOSIS — H25812 Combined forms of age-related cataract, left eye: Secondary | ICD-10-CM | POA: Diagnosis not present

## 2016-11-02 DIAGNOSIS — Z23 Encounter for immunization: Secondary | ICD-10-CM | POA: Diagnosis not present

## 2016-12-01 DIAGNOSIS — H2511 Age-related nuclear cataract, right eye: Secondary | ICD-10-CM | POA: Diagnosis not present

## 2016-12-01 DIAGNOSIS — H25811 Combined forms of age-related cataract, right eye: Secondary | ICD-10-CM | POA: Diagnosis not present

## 2016-12-08 DIAGNOSIS — Z1231 Encounter for screening mammogram for malignant neoplasm of breast: Secondary | ICD-10-CM | POA: Diagnosis not present

## 2016-12-28 DIAGNOSIS — M25551 Pain in right hip: Secondary | ICD-10-CM | POA: Diagnosis not present

## 2017-01-19 DIAGNOSIS — Z961 Presence of intraocular lens: Secondary | ICD-10-CM | POA: Diagnosis not present

## 2017-03-19 DIAGNOSIS — J111 Influenza due to unidentified influenza virus with other respiratory manifestations: Secondary | ICD-10-CM | POA: Diagnosis not present

## 2017-03-19 DIAGNOSIS — R0602 Shortness of breath: Secondary | ICD-10-CM | POA: Diagnosis not present

## 2017-03-19 DIAGNOSIS — J018 Other acute sinusitis: Secondary | ICD-10-CM | POA: Diagnosis not present

## 2017-03-19 DIAGNOSIS — M791 Myalgia, unspecified site: Secondary | ICD-10-CM | POA: Diagnosis not present

## 2017-05-12 DIAGNOSIS — L239 Allergic contact dermatitis, unspecified cause: Secondary | ICD-10-CM | POA: Diagnosis not present

## 2017-07-12 DIAGNOSIS — H26493 Other secondary cataract, bilateral: Secondary | ICD-10-CM | POA: Diagnosis not present

## 2017-08-09 DIAGNOSIS — E039 Hypothyroidism, unspecified: Secondary | ICD-10-CM | POA: Diagnosis not present

## 2017-08-09 DIAGNOSIS — E7800 Pure hypercholesterolemia, unspecified: Secondary | ICD-10-CM | POA: Diagnosis not present

## 2017-08-09 DIAGNOSIS — Z0000 Encounter for general adult medical examination without abnormal findings: Secondary | ICD-10-CM | POA: Diagnosis not present

## 2017-08-09 DIAGNOSIS — Z1389 Encounter for screening for other disorder: Secondary | ICD-10-CM | POA: Diagnosis not present

## 2017-08-13 DIAGNOSIS — Z780 Asymptomatic menopausal state: Secondary | ICD-10-CM | POA: Diagnosis not present

## 2017-09-28 DIAGNOSIS — E039 Hypothyroidism, unspecified: Secondary | ICD-10-CM | POA: Diagnosis not present

## 2017-10-10 ENCOUNTER — Ambulatory Visit (INDEPENDENT_AMBULATORY_CARE_PROVIDER_SITE_OTHER): Payer: Medicare Other

## 2017-10-10 ENCOUNTER — Ambulatory Visit: Payer: Medicare Other | Admitting: Podiatry

## 2017-10-10 ENCOUNTER — Other Ambulatory Visit: Payer: Self-pay

## 2017-10-10 ENCOUNTER — Encounter: Payer: Self-pay | Admitting: Podiatry

## 2017-10-10 VITALS — BP 147/89 | HR 78

## 2017-10-10 DIAGNOSIS — M779 Enthesopathy, unspecified: Secondary | ICD-10-CM

## 2017-10-10 DIAGNOSIS — M79672 Pain in left foot: Secondary | ICD-10-CM | POA: Diagnosis not present

## 2017-10-10 MED ORDER — DICLOFENAC SODIUM 1 % TD GEL: 2.0000 g | Freq: Four times a day (QID) | TRANSDERMAL | 2 refills | Status: DC

## 2017-10-10 NOTE — Patient Instructions (Signed)

## 2017-10-12 NOTE — Progress Notes (Signed)
Subjective:   Patient ID: Sarah Benitez, female   DOB: 77 y.o.   MRN: [id number]   HPI 77 year old female presents the office with concerns of pain to the office of the left foot as well as some pain on the top of the foot.  It seems to start after she did purchase new shoes.  She states the pain was intermittent in nature she denies any recent injury or trauma she denies any redness to the area and only very minor swelling noted.  No recent treatment.  No other concerns.   Review of Systems  All other systems reviewed and are negative.  Past Medical History:  Diagnosis Date  . Arthritis   . High cholesterol     Past Surgical History:  Procedure Laterality Date  . BREAST BIOPSY  1984  . CHOLECYSTECTOMY N/A 07/29/2014   Procedure: LAPAROSCOPIC CHOLECYSTECTOMY WITH INTRAOPERATIVE CHOLANGIOGRAM;  Surgeon: Romie Levee, MD;  Location: [facility];  Service: General;  Laterality: N/A;  . PARTIAL HYSTERECTOMY       Current Outpatient Medications:  .  acetaminophen (TYLENOL) 500 MG tablet, Take 1,000 mg by mouth 2 (two) times daily. , Disp: , Rfl:  .  aspirin 81 MG tablet, Take 81 mg by mouth at bedtime. , Disp: , Rfl:  .  Calcium Carbonate (CALTRATE 600 PO), Take 1 tablet by mouth at bedtime. , Disp: , Rfl:  .  Ginkgo Biloba (GINKOBA PO), Take 1 tablet by mouth daily. , Disp: , Rfl:  .  Glucosamine-Chondroitin (GLUCOSAMINE CHONDR COMPLEX PO), Take 1 tablet by mouth daily. , Disp: , Rfl:  .  levothyroxine (SYNTHROID, LEVOTHROID) 100 MCG tablet, TAKE 1 TABLET BY MOUTH ONCE DAILY IN THE MORNING ON AN EMPTY STOMACH, Disp: , Rfl: 6 .  Multiple Vitamin (MULTIVITAMIN) capsule, Take 1 capsule by mouth at bedtime. , Disp: , Rfl:  .  nabumetone (RELAFEN) 500 MG tablet, Take 500 mg by mouth daily., Disp: , Rfl:  .  nystatin (MYCOSTATIN/NYSTOP) 100000 UNIT/GM POWD, Apply 1 Bottle topically 2 (two) times daily as needed (rash). , Disp: , Rfl: 11 .  Omega-3 Fatty Acids (OMEGA-3 FISH OIL PO), Take 1 capsule  by mouth daily. , Disp: , Rfl:  .  simvastatin (ZOCOR) 20 MG tablet, Take 20 mg by mouth at bedtime. , Disp: , Rfl:  .  ST JOHNS WORT EXTRACT PO, Take 1 tablet by mouth daily. , Disp: , Rfl:  .  cephALEXin (KEFLEX) 500 MG capsule, Take 1 capsule (500 mg total) by mouth 2 (two) times daily., Disp: 30 capsule, Rfl: 0 .  ciprofloxacin (CIPRO) 500 MG tablet, Take 1 tablet (500 mg total) by mouth 2 (two) times daily., Disp: 10 tablet, Rfl: 0 .  diclofenac sodium (VOLTAREN) 1 % GEL, Apply 2 g topically 4 (four) times daily. Rub into affected area of foot 2 to 4 times daily, Disp: 100 g, Rfl: 2 .  HYDROcodone-acetaminophen (NORCO/VICODIN) 5-325 MG per tablet, Take 1-2 tablets by mouth every 4 (four) hours as needed for moderate pain., Disp: 40 tablet, Rfl: 0 .  levothyroxine (SYNTHROID, LEVOTHROID) 112 MCG tablet, Take 112 mcg by mouth daily before breakfast., Disp: , Rfl:   Allergies  Allergen Reactions  . Allegra [Fexofenadine]   . Lipitor [Atorvastatin]   . Betadine [Povidone Iodine] Rash          Objective:  Physical Exam  General: AAO x3, NAD  Dermatological: Skin is warm, dry and supple bilateral. Nails x 10 are well manicured; remaining  integument appears unremarkable at this time. There are no open sores, no preulcerative lesions, no rash or signs of infection present.  Vascular: Dorsalis Pedis artery and Posterior Tibial artery pedal pulses are 2/4 bilateral with immedate capillary fill time. There is no pain with calf compression, swelling, warmth, erythema.   Neruologic: Grossly intact via light touch bilateral. Protective threshold with Semmes Wienstein monofilament intact to all pedal sites bilateral.   Musculoskeletal: There is no area pinpoint tenderness or pain to vibratory sensation.  There is some tenderness along the medial band of plantar fascia the arch of the foot.  Plantar fascia appears to be intact.  Achilles tendon intact.  Extensor, flexor tendon is intact.  Muscular  strength 5/5 in all groups tested bilateral.  Gait: Unassisted, Nonantalgic.       Assessment:   Left foot tendinitis     Plan:  -Treatment options discussed including all alternatives, risks, and complications -Etiology of symptoms were discussed -X-rays were obtained and reviewed.  No evidence of acute fracture or stress fracture identified today. -Voltaren gel -Ice to the area -Discussed change in shoes.   Return in about 4 weeks (around 11/07/2017), or if symptoms worsen or fail to improve.  Vivi Barrack DPM

## 2017-11-09 DIAGNOSIS — Z23 Encounter for immunization: Secondary | ICD-10-CM | POA: Diagnosis not present

## 2017-12-09 DIAGNOSIS — Z1231 Encounter for screening mammogram for malignant neoplasm of breast: Secondary | ICD-10-CM | POA: Diagnosis not present

## 2018-01-09 DIAGNOSIS — R1084 Generalized abdominal pain: Secondary | ICD-10-CM | POA: Diagnosis not present

## 2018-02-02 DIAGNOSIS — M1711 Unilateral primary osteoarthritis, right knee: Secondary | ICD-10-CM | POA: Diagnosis not present

## 2018-02-02 DIAGNOSIS — M25561 Pain in right knee: Secondary | ICD-10-CM | POA: Diagnosis not present

## 2018-02-20 DIAGNOSIS — F439 Reaction to severe stress, unspecified: Secondary | ICD-10-CM | POA: Diagnosis not present

## 2018-02-20 DIAGNOSIS — R5383 Other fatigue: Secondary | ICD-10-CM | POA: Diagnosis not present

## 2018-02-20 DIAGNOSIS — D7589 Other specified diseases of blood and blood-forming organs: Secondary | ICD-10-CM | POA: Diagnosis not present

## 2018-02-20 DIAGNOSIS — G479 Sleep disorder, unspecified: Secondary | ICD-10-CM | POA: Diagnosis not present

## 2018-06-14 ENCOUNTER — Telehealth: Payer: Self-pay | Admitting: *Deleted

## 2018-06-14 NOTE — Telephone Encounter (Signed)
Pt states she is wanting inserts for her shoes and would like a prescription for Good Feet and she has an appt today.

## 2018-06-14 NOTE — Telephone Encounter (Signed)
Left message informing pt our office made custom made orthotics and she should schedule an appt to be reevaluated for her current symptoms and molded for the orthotics in office.

## 2018-07-17 DIAGNOSIS — H524 Presbyopia: Secondary | ICD-10-CM | POA: Diagnosis not present

## 2018-09-11 DIAGNOSIS — E7800 Pure hypercholesterolemia, unspecified: Secondary | ICD-10-CM | POA: Diagnosis not present

## 2018-09-11 DIAGNOSIS — E039 Hypothyroidism, unspecified: Secondary | ICD-10-CM | POA: Diagnosis not present

## 2018-09-11 DIAGNOSIS — Z0000 Encounter for general adult medical examination without abnormal findings: Secondary | ICD-10-CM | POA: Diagnosis not present

## 2018-09-11 DIAGNOSIS — Z1389 Encounter for screening for other disorder: Secondary | ICD-10-CM | POA: Diagnosis not present

## 2018-11-16 DIAGNOSIS — Z23 Encounter for immunization: Secondary | ICD-10-CM | POA: Diagnosis not present

## 2018-12-11 DIAGNOSIS — Z1231 Encounter for screening mammogram for malignant neoplasm of breast: Secondary | ICD-10-CM | POA: Diagnosis not present

## 2019-03-15 DIAGNOSIS — Z7189 Other specified counseling: Secondary | ICD-10-CM | POA: Diagnosis not present

## 2019-03-15 DIAGNOSIS — E039 Hypothyroidism, unspecified: Secondary | ICD-10-CM | POA: Diagnosis not present

## 2019-03-15 DIAGNOSIS — E663 Overweight: Secondary | ICD-10-CM | POA: Diagnosis not present

## 2019-03-15 DIAGNOSIS — E7800 Pure hypercholesterolemia, unspecified: Secondary | ICD-10-CM | POA: Diagnosis not present

## 2019-03-18 ENCOUNTER — Ambulatory Visit: Payer: Medicare Other | Attending: Internal Medicine

## 2019-03-18 DIAGNOSIS — Z23 Encounter for immunization: Secondary | ICD-10-CM | POA: Insufficient documentation

## 2019-03-18 NOTE — Progress Notes (Signed)
   Covid-19 Vaccination Clinic  Name:  Sarah Benitez    MRN: [id number] DOB: [birth year]  03/18/2019  Sarah Benitez was observed post Covid-19 immunization for 15 minutes without incidence. She was provided with Vaccine Information Sheet and instruction to access the V-Safe system.   Sarah Benitez was instructed to call 911 with any severe reactions post vaccine: Marland Kitchen Difficulty breathing  . Swelling of your face and throat  . A fast heartbeat  . A bad rash all over your body  . Dizziness and weakness    Immunizations Administered    Name Date Dose VIS Date Route   Pfizer COVID-19 Vaccine 03/18/2019  1:58 PM 0.3 mL 01/12/2019 Intramuscular   Manufacturer: [employer], [employer]   Lot: [id number]   NDC: [address]-0

## 2019-04-10 ENCOUNTER — Ambulatory Visit: Payer: Medicare Other | Attending: Internal Medicine

## 2019-04-10 DIAGNOSIS — Z23 Encounter for immunization: Secondary | ICD-10-CM

## 2019-04-10 NOTE — Progress Notes (Signed)
   Covid-19 Vaccination Clinic  Name:  Sarah Benitez    MRN: [id number] DOB: [birth year]  04/10/2019  [name] was observed post Covid-19 immunization for 15 minutes without incident. She was provided with Vaccine Information Sheet and instruction to access the V-Safe system.   [name] was instructed to call 911 with any severe reactions post vaccine: Marland Kitchen Difficulty breathing  . Swelling of face and throat  . A fast heartbeat  . A bad rash all over body  . Dizziness and weakness   Immunizations Administered    Name Date Dose VIS Date Route   Pfizer COVID-19 Vaccine 04/10/2019  8:08 AM 0.3 mL 01/12/2019 Intramuscular   Manufacturer: [employer], [employer]   Lot: [id number]   NDC: [address]-2   Pfizer COVID-19 Vaccine 04/10/2019  8:12 AM 0.3 mL 01/12/2019 Intramuscular   Manufacturer: [employer], [employer]   Lot: [id number]   NDC: [address]-0

## 2019-04-11 ENCOUNTER — Ambulatory Visit: Payer: Medicare Other

## 2019-07-19 DIAGNOSIS — H5213 Myopia, bilateral: Secondary | ICD-10-CM | POA: Diagnosis not present

## 2019-07-19 DIAGNOSIS — H524 Presbyopia: Secondary | ICD-10-CM | POA: Diagnosis not present

## 2019-07-31 ENCOUNTER — Other Ambulatory Visit: Payer: Self-pay

## 2019-07-31 ENCOUNTER — Ambulatory Visit
Admission: EM | Admit: 2019-07-31 | Discharge: 2019-07-31 | Disposition: A | Discharge status: Home / Self Care | Payer: Medicare Other | Attending: Emergency Medicine | Admitting: Emergency Medicine

## 2019-07-31 DIAGNOSIS — L03116 Cellulitis of left lower limb: Secondary | ICD-10-CM

## 2019-07-31 DIAGNOSIS — S81812A Laceration without foreign body, left lower leg, initial encounter: Secondary | ICD-10-CM

## 2019-07-31 MED ORDER — FLUCONAZOLE 200 MG PO TABS: 200.0000 mg | ORAL_TABLET | Freq: Once | ORAL | 0 refills | Status: AC

## 2019-07-31 MED ORDER — DOXYCYCLINE HYCLATE 100 MG PO CAPS: 100.0000 mg | ORAL_CAPSULE | Freq: Two times a day (BID) | ORAL | 0 refills | Status: AC

## 2019-07-31 NOTE — ED Triage Notes (Signed)
Pt scraped left leg climbing onto porch on Father's Day, has wound to leg, states redness has gotten worse and spreading down leg

## 2019-07-31 NOTE — ED Provider Notes (Signed)
[facility]    CSN: [id number] Arrival date & time: 07/31/19  4562      History   Chief Complaint Chief Complaint  Patient presents with  . Wound Check    HPI Sarah Benitez is a 79 y.o. female history of hyperlipidemia, arthritis presenting for left leg laceration.  States this occurred 10 days ago.  Has been keeping the wound clean, dry at home.  No arthralgias, myalgias, fever.  States she noticed surrounding redness getting worse the other night.  Also endorsing pain.   Past Medical History:  Diagnosis Date  . Arthritis   . High cholesterol     Patient Active Problem List   Diagnosis Date Noted  . Acute cholecystitis 07/28/2014    Past Surgical History:  Procedure Laterality Date  . BREAST BIOPSY  1984  . CHOLECYSTECTOMY N/A 07/29/2014   Procedure: LAPAROSCOPIC CHOLECYSTECTOMY WITH INTRAOPERATIVE CHOLANGIOGRAM;  Surgeon: Romie Levee, MD;  Location: [facility];  Service: General;  Laterality: N/A;  . PARTIAL HYSTERECTOMY      OB History   No obstetric history on file.      Home Medications    Prior to Admission medications   Medication Sig Start Date End Date Taking? Authorizing Provider  acetaminophen (TYLENOL) 500 MG tablet Take 1,000 mg by mouth 2 (two) times daily.     [provider]  aspirin 81 MG tablet Take 81 mg by mouth at bedtime.     [provider]  Calcium Carbonate (CALTRATE 600 PO) Take 1 tablet by mouth at bedtime.     [provider]  diclofenac sodium (VOLTAREN) 1 % GEL Apply 2 g topically 4 (four) times daily. Rub into affected area of foot 2 to 4 times daily 10/10/17   Vivi Barrack, DPM  doxycycline (VIBRAMYCIN) 100 MG capsule Take 1 capsule (100 mg total) by mouth 2 (two) times daily for 7 days. 07/31/19 08/07/19  Hall-Potvin, [place], PA-C  fluconazole (DIFLUCAN) 200 MG tablet Take 1 tablet (200 mg total) by mouth once for 1 dose. May repeat in 72 hours if needed 07/31/19 07/31/19  Hall-Potvin,  [place], PA-C  Ginkgo Biloba (GINKOBA PO) Take 1 tablet by mouth daily.     [provider]  Glucosamine-Chondroitin (GLUCOSAMINE CHONDR COMPLEX PO) Take 1 tablet by mouth daily.     [provider]  levothyroxine (SYNTHROID, LEVOTHROID) 100 MCG tablet TAKE 1 TABLET BY MOUTH ONCE DAILY IN THE MORNING ON AN EMPTY STOMACH 10/05/17   [provider]  Multiple Vitamin (MULTIVITAMIN) capsule Take 1 capsule by mouth at bedtime.     [provider]  nabumetone (RELAFEN) 500 MG tablet Take 500 mg by mouth daily.    [provider]  nystatin (MYCOSTATIN/NYSTOP) 100000 UNIT/GM POWD Apply 1 Bottle topically 2 (two) times daily as needed (rash).  06/20/14   [provider]  Omega-3 Fatty Acids (OMEGA-3 FISH OIL PO) Take 1 capsule by mouth daily.     [provider]  simvastatin (ZOCOR) 20 MG tablet Take 20 mg by mouth at bedtime.     [provider]  ST JOHNS WORT EXTRACT PO Take 1 tablet by mouth daily.     [provider]    Family History History reviewed. No pertinent family history.  Social History Social History   Tobacco Use  . Smoking status: Never Smoker  . Smokeless tobacco: Never Used  Substance Use Topics  . Alcohol use: No    Alcohol/week: 0.0 standard drinks  .  Drug use: No     Allergies   Allegra [fexofenadine], Lipitor [atorvastatin], and Betadine [povidone iodine]   Review of Systems As per HPI   Physical Exam Triage Vital Signs ED Triage Vitals  Enc Vitals Group     BP      Pulse      Resp      Temp      Temp src      SpO2      Weight      Height      Head Circumference      Peak Flow      Pain Score      Pain Loc      Pain Edu?      Excl. in GC?    No data found.  Updated Vital Signs BP (!) 171/74   Pulse 97   Temp 98.1 F (36.7 C)   Resp 18   SpO2 93%   Visual Acuity Right Eye Distance:   Left Eye Distance:   Bilateral Distance:    Right Eye Near:   Left Eye  Near:    Bilateral Near:     Physical Exam Constitutional:      General: She is not in acute distress. HENT:     Head: Normocephalic and atraumatic.  Eyes:     General: No scleral icterus.    Pupils: Pupils are equal, round, and reactive to light.  Cardiovascular:     Rate and Rhythm: Normal rate.  Pulmonary:     Effort: Pulmonary effort is normal.  Skin:    Coloration: Skin is not jaundiced or pale.     Comments: 15 cm closed laceration noted to anterior aspect of left lower leg.  Patient does have inferior surrounding erythema with warmth and tenderness.  No fluctuance, mass.  NVI  Neurological:     Mental Status: She is alert and oriented to person, place, and time.      UC Treatments / Results  Labs (all labs ordered are listed, but only abnormal results are displayed) Labs Reviewed - No data to display  EKG   Radiology No results found.  Procedures Procedures (including critical care time)  Medications Ordered in UC Medications - No data to display  Initial Impression / Assessment and Plan / UC Course  I have reviewed the triage vital signs and the nursing notes.  Pertinent labs & imaging results that were available during my care of the patient were reviewed by me and considered in my medical decision making (see chart for details).     Patient afebrile, nontoxic in office today.  H&P concerning for cellulitis second to laceration.  Last tetanus in 2018.  Will start doxycycline, have patient follow-up with PCP in 1 week for repeat evaluation.  Return precautions discussed, patient verbalized understanding and is agreeable to plan. Final Clinical Impressions(s) / UC Diagnoses   Final diagnoses:  Laceration of left lower extremity, initial encounter  Cellulitis of left lower extremity     Discharge Instructions     Keep area(s) clean and dry. Take antibiotic as prescribed with food - important to complete course. Return for worsening pain, redness,  swelling, discharge, fever.  Helpful prevention tips: Keep nails short to avoid secondary skin infections. Use new, clean razors when shaving. Avoid antiperspirants - look for deodorants without aluminum. Avoid wearing underwire bras as this can irritate the area further.     ED Prescriptions    Medication Sig Dispense Auth. Provider  doxycycline (VIBRAMYCIN) 100 MG capsule Take 1 capsule (100 mg total) by mouth 2 (two) times daily for 7 days. 14 capsule Hall-Potvin, [place], PA-C   fluconazole (DIFLUCAN) 200 MG tablet Take 1 tablet (200 mg total) by mouth once for 1 dose. May repeat in 72 hours if needed 2 tablet Hall-Potvin, [place], PA-C     PDMP not reviewed this encounter.   Hall-Potvin, [place], [place] 07/31/19 [phone]

## 2019-07-31 NOTE — Discharge Instructions (Signed)
Keep area(s) clean and dry. Take antibiotic as prescribed with food - important to complete course. Return for worsening pain, redness, swelling, discharge, fever.  Helpful prevention tips: Keep nails short to avoid secondary skin infections. Use new, clean razors when shaving. Avoid antiperspirants - look for deodorants without aluminum. Avoid wearing underwire bras as this can irritate the area further.

## 2019-08-09 DIAGNOSIS — L03116 Cellulitis of left lower limb: Secondary | ICD-10-CM | POA: Diagnosis not present

## 2019-08-09 DIAGNOSIS — W19XXXD Unspecified fall, subsequent encounter: Secondary | ICD-10-CM | POA: Diagnosis not present

## 2019-08-09 DIAGNOSIS — S81812D Laceration without foreign body, left lower leg, subsequent encounter: Secondary | ICD-10-CM | POA: Diagnosis not present

## 2019-09-13 ENCOUNTER — Ambulatory Visit
Admission: RE | Admit: 2019-09-13 | Discharge: 2019-09-13 | Disposition: A | Discharge status: Home / Self Care | Payer: Medicare Other | Source: Ambulatory Visit | Attending: Internal Medicine | Admitting: Internal Medicine

## 2019-09-13 ENCOUNTER — Other Ambulatory Visit: Payer: Self-pay | Admitting: Internal Medicine

## 2019-09-13 DIAGNOSIS — R05 Cough: Secondary | ICD-10-CM | POA: Diagnosis not present

## 2019-09-13 DIAGNOSIS — E7800 Pure hypercholesterolemia, unspecified: Secondary | ICD-10-CM | POA: Diagnosis not present

## 2019-09-13 DIAGNOSIS — Z1389 Encounter for screening for other disorder: Secondary | ICD-10-CM | POA: Diagnosis not present

## 2019-09-13 DIAGNOSIS — Z7189 Other specified counseling: Secondary | ICD-10-CM | POA: Diagnosis not present

## 2019-09-13 DIAGNOSIS — R059 Cough, unspecified: Secondary | ICD-10-CM

## 2019-09-13 DIAGNOSIS — Z0000 Encounter for general adult medical examination without abnormal findings: Secondary | ICD-10-CM | POA: Diagnosis not present

## 2019-09-13 DIAGNOSIS — E663 Overweight: Secondary | ICD-10-CM | POA: Diagnosis not present

## 2019-09-13 DIAGNOSIS — E039 Hypothyroidism, unspecified: Secondary | ICD-10-CM | POA: Diagnosis not present

## 2019-09-17 ENCOUNTER — Other Ambulatory Visit: Payer: Self-pay

## 2019-09-17 ENCOUNTER — Other Ambulatory Visit: Payer: Medicare Other

## 2019-09-17 DIAGNOSIS — Z20822 Contact with and (suspected) exposure to covid-19: Secondary | ICD-10-CM | POA: Diagnosis not present

## 2019-09-18 LAB — NOVEL CORONAVIRUS, NAA: SARS-CoV-2, NAA: NOT DETECTED

## 2019-09-18 LAB — SARS-COV-2, NAA 2 DAY TAT

## 2019-09-26 DIAGNOSIS — M25551 Pain in right hip: Secondary | ICD-10-CM | POA: Diagnosis not present

## 2019-12-13 DIAGNOSIS — Z1231 Encounter for screening mammogram for malignant neoplasm of breast: Secondary | ICD-10-CM | POA: Diagnosis not present

## 2019-12-24 DIAGNOSIS — E7800 Pure hypercholesterolemia, unspecified: Secondary | ICD-10-CM | POA: Diagnosis not present

## 2019-12-24 DIAGNOSIS — E039 Hypothyroidism, unspecified: Secondary | ICD-10-CM | POA: Diagnosis not present

## 2019-12-24 DIAGNOSIS — I10 Essential (primary) hypertension: Secondary | ICD-10-CM | POA: Diagnosis not present

## 2019-12-24 DIAGNOSIS — E782 Mixed hyperlipidemia: Secondary | ICD-10-CM | POA: Diagnosis not present

## 2020-03-11 ENCOUNTER — Ambulatory Visit: Payer: Medicare Other | Admitting: Podiatry

## 2020-03-11 ENCOUNTER — Other Ambulatory Visit: Payer: Self-pay

## 2020-03-11 DIAGNOSIS — L600 Ingrowing nail: Secondary | ICD-10-CM | POA: Diagnosis not present

## 2020-03-11 DIAGNOSIS — M79674 Pain in right toe(s): Secondary | ICD-10-CM | POA: Diagnosis not present

## 2020-03-11 DIAGNOSIS — M79675 Pain in left toe(s): Secondary | ICD-10-CM

## 2020-03-11 MED ORDER — CEPHALEXIN 500 MG PO CAPS: 500.0000 mg | ORAL_CAPSULE | Freq: Three times a day (TID) | ORAL | 0 refills | Status: DC

## 2020-03-11 NOTE — Patient Instructions (Signed)

## 2020-03-17 DIAGNOSIS — E7800 Pure hypercholesterolemia, unspecified: Secondary | ICD-10-CM | POA: Diagnosis not present

## 2020-03-17 DIAGNOSIS — E663 Overweight: Secondary | ICD-10-CM | POA: Diagnosis not present

## 2020-03-17 DIAGNOSIS — E039 Hypothyroidism, unspecified: Secondary | ICD-10-CM | POA: Diagnosis not present

## 2020-03-17 NOTE — Progress Notes (Addendum)
Subjective: 80 year old female presents the occipital concerns of both of her big toenails becoming ingrown as they are getting sore.  Denies any drainage or pus but the area is tender with pressure in shoes.  No recent treatment. Denies any systemic complaints such as fevers, chills, nausea, vomiting. No acute changes since last appointment, and no other complaints at this time.   Objective: AAO x3, NAD DP/PT pulses palpable bilaterally, CRT less than 3 seconds Incurvation present to bilateral hallux toenails with localized edema the distal aspect there is no drainage or pus or ascending cellulitis with the left worse than right.  The nails hypertrophic, dystrophic. There is no fluctuation or crepitation but there is no open lesions. No pain with calf compression, swelling, warmth, erythema  Assessment: Ingrown toenail L > R Plan: -All treatment options discussed with the patient including all alternatives, risks, complications.  -We discussed partial nail removal versus total removal.  Electrical and proceed with left total nail removal.  See procedure note below.  -At this time, the patient is requesting total nail removal with chemical matricectomy to the symptomatic portion of the nail. Risks and complications were discussed with the patient for which they understand and written consent was obtained. Under sterile conditions a total of 3 mL of a mixture of 2% lidocaine plain and 0.5% Marcaine plain was infiltrated in a hallux block fashion. Once anesthetized, the skin was prepped in sterile fashion. A tourniquet was then applied. Next the left hallux nail  was then sharply excised making sure to remove the entire offending nail border. Once the nails were ensured to be removed area was debrided and the underlying skin was intact. There is no purulence identified in the procedure. Next phenol was then applied under standard conditions and copiously irrigated. Silvadene was applied. A dry sterile  dressing was applied. After application of the dressing the tourniquet was removed and there is found to be an immediate capillary refill time to the digit. The patient tolerated the procedure well any complications. Post procedure instructions were discussed the patient for which he verbally understood. Follow-up in one week for nail check or sooner if any problems are to arise. Discussed signs/symptoms of infection and directed to call the office immediately should any occur or go directly to the emergency room. In the meantime, encouraged to call the office with any questions, concerns, changes symptoms. -Debrided the right hallux nail without any complications or bleeding -Keflex -Patient encouraged to call the office with any questions, concerns, change in symptoms.   Sarah Benitez DPM

## 2020-03-24 ENCOUNTER — Other Ambulatory Visit: Payer: Self-pay

## 2020-03-24 ENCOUNTER — Ambulatory Visit: Payer: Medicare Other | Admitting: Podiatry

## 2020-03-24 DIAGNOSIS — L600 Ingrowing nail: Secondary | ICD-10-CM

## 2020-03-24 DIAGNOSIS — L539 Erythematous condition, unspecified: Secondary | ICD-10-CM

## 2020-03-24 MED ORDER — CEPHALEXIN 500 MG PO CAPS: 500.0000 mg | ORAL_CAPSULE | Freq: Three times a day (TID) | ORAL | 0 refills | Status: DC

## 2020-03-25 NOTE — Progress Notes (Signed)
Subjective: 80 year old female presents the office today for concerns of possible infection to her left big toenail.  She presented nail removed she can soak in Epson salts but she noticed some redness and swelling along the proximal nail fold which made the appointment.  Since then it started improved some.  Denies any drainage or pus.  She does feel that the tenderness is getting better. Denies any systemic complaints such as fevers, chills, nausea, vomiting. No acute changes since last appointment, and no other complaints at this time.   Objective: AAO x3, NAD DP/PT pulses palpable bilaterally, CRT less than 3 seconds There is a scab present on the left hallux nail fold.  There is no immediate capillary refill to the digit.  There is faint erythema surrounding the area but there is no drainage or pus there is no ascending cellulitis.  There is minimal edema.  There is no fluctuation, crepitation or malodor. No pain with calf compression, swelling, warmth, erythema  Assessment: 80 year old female status post left hallux nail avulsion with mild surrounding erythema  Plan: -All treatment options discussed with the patient including all alternatives, risks, complications.  -Recommend continue soaking Epson salts covered with antibiotic ointment and dressing daily.  As a precaution refilled Keflex. -Monitor for any clinical signs or symptoms of infection and directed to call the office immediately should any occur or go to the ER. -Patient encouraged to call the office with any questions, concerns, change in symptoms.   Vivi Barrack DPM

## 2020-04-07 ENCOUNTER — Ambulatory Visit: Payer: Medicare Other | Admitting: Podiatry

## 2020-05-16 DIAGNOSIS — I10 Essential (primary) hypertension: Secondary | ICD-10-CM | POA: Diagnosis not present

## 2020-05-16 DIAGNOSIS — E7800 Pure hypercholesterolemia, unspecified: Secondary | ICD-10-CM | POA: Diagnosis not present

## 2020-05-16 DIAGNOSIS — E039 Hypothyroidism, unspecified: Secondary | ICD-10-CM | POA: Diagnosis not present

## 2020-05-16 DIAGNOSIS — E782 Mixed hyperlipidemia: Secondary | ICD-10-CM | POA: Diagnosis not present

## 2020-06-09 ENCOUNTER — Other Ambulatory Visit: Payer: Self-pay

## 2020-06-09 ENCOUNTER — Encounter: Payer: Self-pay | Admitting: Podiatry

## 2020-06-09 ENCOUNTER — Ambulatory Visit: Payer: Medicare Other | Admitting: Podiatry

## 2020-06-09 DIAGNOSIS — Z86010 Personal history of colonic polyps: Secondary | ICD-10-CM | POA: Insufficient documentation

## 2020-06-09 DIAGNOSIS — M79675 Pain in left toe(s): Secondary | ICD-10-CM | POA: Diagnosis not present

## 2020-06-09 DIAGNOSIS — F439 Reaction to severe stress, unspecified: Secondary | ICD-10-CM | POA: Insufficient documentation

## 2020-06-09 DIAGNOSIS — M25419 Effusion, unspecified shoulder: Secondary | ICD-10-CM | POA: Insufficient documentation

## 2020-06-09 DIAGNOSIS — K649 Unspecified hemorrhoids: Secondary | ICD-10-CM | POA: Insufficient documentation

## 2020-06-09 DIAGNOSIS — E7800 Pure hypercholesterolemia, unspecified: Secondary | ICD-10-CM | POA: Insufficient documentation

## 2020-06-09 DIAGNOSIS — M79674 Pain in right toe(s): Secondary | ICD-10-CM | POA: Diagnosis not present

## 2020-06-09 DIAGNOSIS — M25519 Pain in unspecified shoulder: Secondary | ICD-10-CM | POA: Insufficient documentation

## 2020-06-09 DIAGNOSIS — B351 Tinea unguium: Secondary | ICD-10-CM

## 2020-06-09 DIAGNOSIS — M19029 Primary osteoarthritis, unspecified elbow: Secondary | ICD-10-CM | POA: Insufficient documentation

## 2020-06-09 DIAGNOSIS — Z7189 Other specified counseling: Secondary | ICD-10-CM | POA: Insufficient documentation

## 2020-06-09 DIAGNOSIS — G479 Sleep disorder, unspecified: Secondary | ICD-10-CM | POA: Insufficient documentation

## 2020-06-09 DIAGNOSIS — E2839 Other primary ovarian failure: Secondary | ICD-10-CM | POA: Insufficient documentation

## 2020-06-09 DIAGNOSIS — E039 Hypothyroidism, unspecified: Secondary | ICD-10-CM | POA: Insufficient documentation

## 2020-06-09 DIAGNOSIS — E782 Mixed hyperlipidemia: Secondary | ICD-10-CM | POA: Insufficient documentation

## 2020-06-09 DIAGNOSIS — E663 Overweight: Secondary | ICD-10-CM | POA: Insufficient documentation

## 2020-06-09 DIAGNOSIS — I10 Essential (primary) hypertension: Secondary | ICD-10-CM | POA: Insufficient documentation

## 2020-06-09 DIAGNOSIS — D7589 Other specified diseases of blood and blood-forming organs: Secondary | ICD-10-CM | POA: Insufficient documentation

## 2020-06-11 DIAGNOSIS — E039 Hypothyroidism, unspecified: Secondary | ICD-10-CM | POA: Diagnosis not present

## 2020-06-11 DIAGNOSIS — E7800 Pure hypercholesterolemia, unspecified: Secondary | ICD-10-CM | POA: Diagnosis not present

## 2020-06-11 DIAGNOSIS — E782 Mixed hyperlipidemia: Secondary | ICD-10-CM | POA: Diagnosis not present

## 2020-06-11 DIAGNOSIS — I10 Essential (primary) hypertension: Secondary | ICD-10-CM | POA: Diagnosis not present

## 2020-06-14 NOTE — Progress Notes (Signed)
Subjective: 80 y.o. returns the office today for painful, elongated, thickened toenails which she cannot trim hersself. Denies any redness or drainage around the nails.  States that the procedure on the left hallux toenail has been doing well and is healed.  Denies any acute changes since last appointment and no new complaints today. Denies any systemic complaints such as fevers, chills, nausea, vomiting.   PCP: Renford Dills, MD Last Seen: 05/15/2020  Objective: AAO 3, NAD DP/PT pulses palpable, CRT less than 3 seconds Nails hypertrophic, dystrophic, elongated, brittle, discolored 10. There is tenderness overlying the nails 1-5 bilaterally. There is no surrounding erythema or drainage along the nail sites. No open lesions or pre-ulcerative lesions are identified. No other areas of tenderness bilateral lower extremities. No overlying edema, erythema, increased warmth. No pain with calf compression, swelling, warmth, erythema.  Assessment: Patient presents with symptomatic onychomycosis; history of ingrown toenails  Plan: -Treatment options including alternatives, risks, complications were discussed -Nails sharply debrided 10 without complication/bleeding. -Discussed daily foot inspection. If there are any changes, to call the office immediately.  -Follow-up in 3 months or sooner if any problems are to arise. In the meantime, encouraged to call the office with any questions, concerns, changes symptoms.  Ovid Curd, DPM

## 2020-07-25 DIAGNOSIS — H524 Presbyopia: Secondary | ICD-10-CM | POA: Diagnosis not present

## 2020-09-08 ENCOUNTER — Ambulatory Visit (INDEPENDENT_AMBULATORY_CARE_PROVIDER_SITE_OTHER): Payer: Medicare Other | Admitting: Podiatry

## 2020-09-08 ENCOUNTER — Encounter: Payer: Self-pay | Admitting: Podiatry

## 2020-09-08 ENCOUNTER — Other Ambulatory Visit: Payer: Self-pay

## 2020-09-08 DIAGNOSIS — B351 Tinea unguium: Secondary | ICD-10-CM

## 2020-09-08 DIAGNOSIS — M79675 Pain in left toe(s): Secondary | ICD-10-CM

## 2020-09-08 DIAGNOSIS — M79674 Pain in right toe(s): Secondary | ICD-10-CM

## 2020-09-11 NOTE — Progress Notes (Signed)
Subjective: 80 y.o. returns the office today for painful, elongated, thickened toenails which she cannot trim herself. Denies any redness or drainage around the nails.  Denies any acute changes since last appointment and no new complaints today. Denies any systemic complaints such as fevers, chills, nausea, vomiting.   PCP: Renford Dills, MD Last Seen: 05/15/2020  Objective: AAO 3, NAD DP/PT pulses palpable, CRT less than 3 seconds Nails hypertrophic, dystrophic, elongated, brittle, discolored 10. There is tenderness overlying the nails 1-5 bilaterally. There is no surrounding erythema or drainage along the nail sites. No open lesions or pre-ulcerative lesions are identified. Small amount of skin irritation left fourth interspace.  Appears to be tinea pedis.  She is been using steroid cream which has been helpful. No pain with calf compression, swelling, warmth, erythema.  Assessment: Patient presents with symptomatic onychomycosis; history of ingrown toenails  Plan: -Treatment options including alternatives, risks, complications were discussed -Nails sharply debrided 10 without complication/bleeding. -Steroid cream seems to be helping she is to continue this.  If no improvement will do antifungal. -Discussed daily foot inspection. If there are any changes, to call the office immediately.  -Follow-up in 3 months or sooner if any problems are to arise. In the meantime, encouraged to call the office with any questions, concerns, changes symptoms.  Ovid Curd, DPM

## 2020-09-16 ENCOUNTER — Emergency Department (HOSPITAL_COMMUNITY): Payer: Medicare Other

## 2020-09-16 ENCOUNTER — Emergency Department (HOSPITAL_COMMUNITY)
Admission: EM | Admit: 2020-09-16 | Discharge: 2020-09-16 | Disposition: A | Discharge status: Home / Self Care | Payer: Medicare Other | Attending: Emergency Medicine | Admitting: Emergency Medicine

## 2020-09-16 ENCOUNTER — Other Ambulatory Visit: Payer: Self-pay

## 2020-09-16 ENCOUNTER — Encounter (HOSPITAL_COMMUNITY): Payer: Self-pay

## 2020-09-16 DIAGNOSIS — Z79899 Other long term (current) drug therapy: Secondary | ICD-10-CM | POA: Diagnosis not present

## 2020-09-16 DIAGNOSIS — R0789 Other chest pain: Secondary | ICD-10-CM | POA: Diagnosis not present

## 2020-09-16 DIAGNOSIS — I10 Essential (primary) hypertension: Secondary | ICD-10-CM | POA: Insufficient documentation

## 2020-09-16 DIAGNOSIS — R079 Chest pain, unspecified: Secondary | ICD-10-CM | POA: Diagnosis not present

## 2020-09-16 DIAGNOSIS — H6121 Impacted cerumen, right ear: Secondary | ICD-10-CM | POA: Diagnosis not present

## 2020-09-16 DIAGNOSIS — E7800 Pure hypercholesterolemia, unspecified: Secondary | ICD-10-CM | POA: Diagnosis not present

## 2020-09-16 DIAGNOSIS — Z7982 Long term (current) use of aspirin: Secondary | ICD-10-CM | POA: Diagnosis not present

## 2020-09-16 DIAGNOSIS — E039 Hypothyroidism, unspecified: Secondary | ICD-10-CM | POA: Insufficient documentation

## 2020-09-16 DIAGNOSIS — R1012 Left upper quadrant pain: Secondary | ICD-10-CM | POA: Insufficient documentation

## 2020-09-16 LAB — TROPONIN I (HIGH SENSITIVITY)
Troponin I (High Sensitivity): 3 ng/L (ref <18)
Troponin I (High Sensitivity): 3 ng/L (ref <18)

## 2020-09-16 LAB — COMPREHENSIVE METABOLIC PANEL
ALT: 18 U/L (ref 0–44)
AST: 25 U/L (ref 15–41)
Albumin: 4.2 g/dL (ref 3.5–5.0)
Alkaline Phosphatase: 55 U/L (ref 38–126)
Anion gap: 7 (ref 5–15)
BUN: 18 mg/dL (ref 8–23)
CO2: 25 mmol/L (ref 22–32)
Calcium: 9.3 mg/dL (ref 8.9–10.3)
Chloride: 108 mmol/L (ref 98–111)
Creatinine, Ser: 0.81 mg/dL (ref 0.44–1.00)
GFR, Estimated: >60 mL/min (ref >60)
Glucose, Bld: 102 mg/dL — ABNORMAL HIGH (ref 70–99)
Potassium: 3.9 mmol/L (ref 3.5–5.1)
Sodium: 140 mmol/L (ref 135–145)
Total Bilirubin: 1.2 mg/dL (ref 0.3–1.2)
Total Protein: 7.5 g/dL (ref 6.5–8.1)

## 2020-09-16 LAB — CBC WITH DIFFERENTIAL/PLATELET
Abs Immature Granulocytes: 0.02 10*3/uL (ref 0.00–0.07)
Basophils Absolute: 0 10*3/uL (ref 0.0–0.1)
Basophils Relative: 1 %
Eosinophils Absolute: 0.2 10*3/uL (ref 0.0–0.5)
Eosinophils Relative: 2 %
HCT: 42.2 % (ref 36.0–46.0)
Hemoglobin: 14.6 g/dL (ref 12.0–15.0)
Immature Granulocytes: 0 %
Lymphocytes Relative: 26 %
Lymphs Abs: 2.2 10*3/uL (ref 0.7–4.0)
MCH: 34.2 pg — ABNORMAL HIGH (ref 26.0–34.0)
MCHC: 34.6 g/dL (ref 30.0–36.0)
MCV: 98.8 fL (ref 80.0–100.0)
Monocytes Absolute: 0.6 10*3/uL (ref 0.1–1.0)
Monocytes Relative: 7 %
Neutro Abs: 5.5 10*3/uL (ref 1.7–7.7)
Neutrophils Relative %: 64 %
Platelets: 198 10*3/uL (ref 150–400)
RBC: 4.27 MIL/uL (ref 3.87–5.11)
RDW: 11.5 % (ref 11.5–15.5)
WBC: 8.5 10*3/uL (ref 4.0–10.5)
nRBC: 0 % (ref 0.0–0.2)

## 2020-09-16 NOTE — ED Notes (Signed)
Provider at the bedside to evaluate. 

## 2020-09-16 NOTE — ED Provider Notes (Signed)
Emergency Medicine Provider Triage Evaluation Note  Sarah Benitez , a 80 y.o. female  was evaluated in triage.  Pt complains of abnormal outpatient labs.  Patient states she was seen by her primary care provider for her routine checkup today, she mentioned that she had an episode of chest discomfort earlier this morning therefore they obtained an EKG and blood work.  They called and told her her troponin was high and that she needed to come to the emergency department.  She states that she was eating at the table when she felt a grabbing type sensation to the left lower lateral chest with some indigestion, this lasted for a little bit but ultimately resolved and has not reoccurred.  She is pain-free at present.  She denies any associated nausea, vomiting, diaphoresis, or shortness of breath.  Denies history of CAD.  Review of Systems  Positive: Chest pain Negative: Shortness of breath, nausea vomiting, diaphoresis  Physical Exam  BP (!) 149/65 (BP Location: Left Arm)   Pulse 85   Temp 98.1 F (36.7 C) (Oral)   Resp 20   SpO2 94%  Gen:   Awake, no distress   Resp:  Normal effort  MSK:   Moves extremities without difficulty   Medical Decision Making  Medically screening exam initiated at 4:09 PM.  Appropriate orders placed.  Jaymes Revels was informed that the remainder of the evaluation will be completed by another provider, this initial triage assessment does not replace that evaluation, and the importance of remaining in the ED until their evaluation is complete.  Abnormal labs   Desmond Lope 09/16/20 1611    Derwood Kaplan, MD 09/16/20 1724

## 2020-09-16 NOTE — ED Notes (Signed)
Pt in ED exam room from xray.

## 2020-09-16 NOTE — ED Provider Notes (Signed)
[facility]-EMERGENCY DEPT Provider Note   CSN: [id number] Arrival date & time: 09/16/20  1524     History Chief Complaint  Patient presents with   Abnormal Lab    Sarah Benitez is a 80 y.o. female.  81 year old female presents from doctor's office after having elevated troponin.  Patient states that today she had acute onset of left upper quadrant abdominal discomfort.  States it last for about 10 minutes and was not associated with dyspnea, diaphoresis.  It improved after she had a good burp.  Patient was at her doctor today for her regular checkup.  Blood work was done at that time which showed elevated troponin.  She has no prior history of cardiac disease.  Patient states he feels at her baseline.      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   Other specified counseling 06/09/2020   Overweight 06/09/2020   Pure hypercholesterolemia 06/09/2020   Shoulder joint pain 06/09/2020   Stress at home 06/09/2020   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     History reviewed. No pertinent family history.  Social History   Tobacco Use   Smoking status: Never   Smokeless tobacco: Never  Substance Use Topics   Alcohol use: No    Alcohol/week: 0.0 standard drinks   Drug use: No    Home Medications Prior to Admission medications   Medication Sig Start Date End Date Taking? Authorizing Provider  acetaminophen (TYLENOL) 500 MG tablet Take 1,000 mg by mouth 2 (two) times daily.     [provider]  aspirin 81 MG tablet Take 81 mg by mouth at bedtime.     [provider]  Calcium Carbonate (CALTRATE 600 PO) Take 1 tablet by mouth at bedtime.     [provider]  cephALEXin (KEFLEX) 500 MG capsule Take 1 capsule (500 mg total) by mouth 3 (three) times daily. 03/24/20   Vivi Barrack, DPM  diclofenac sodium (VOLTAREN) 1 % GEL Apply 2 g topically 4 (four) times daily. Rub into affected area of foot 2 to 4 times daily 10/10/17   Vivi Barrack, DPM  Ginkgo Biloba (GINKOBA PO) Take 1 tablet by mouth daily.     [provider]  Glucosamine-Chondroitin (GLUCOSAMINE CHONDR COMPLEX PO) Take 1 tablet by mouth daily.     [provider]  levothyroxine (SYNTHROID, LEVOTHROID) 100 MCG tablet TAKE 1 TABLET BY MOUTH ONCE DAILY IN THE MORNING ON AN EMPTY STOMACH 10/05/17   [provider]  Multiple Vitamin (MULTIVITAMIN)  capsule Take 1 capsule by mouth at bedtime.     [provider]  nabumetone (RELAFEN) 500 MG tablet Take 500 mg by mouth daily.    [provider]  nystatin (MYCOSTATIN/NYSTOP) 100000 UNIT/GM POWD Apply 1 Bottle topically 2 (two) times daily as needed (rash).  06/20/14   [provider]  Omega-3 Fatty Acids (OMEGA-3 FISH OIL PO) Take 1 capsule by mouth daily.     [provider]  simvastatin (ZOCOR) 20 MG tablet Take 20 mg by mouth at bedtime.     [provider]  ST JOHNS WORT EXTRACT PO Take 1 tablet by  mouth daily.     [provider]    Allergies    Allegra [fexofenadine], Lipitor [atorvastatin], and Betadine [povidone iodine]  Review of Systems   Review of Systems  All other systems reviewed and are negative.  Physical Exam Updated Vital Signs BP (!) 149/65 (BP Location: Left Arm)   Pulse 85   Temp 98.1 F (36.7 C) (Oral)   Resp 20   SpO2 94%   Physical Exam Vitals and nursing note reviewed.  Constitutional:      General: She is not in acute distress.    Appearance: Normal appearance. She is well-developed. She is not toxic-appearing.  HENT:     Head: Normocephalic and atraumatic.  Eyes:     General: Lids are normal.     Conjunctiva/sclera: Conjunctivae normal.     Pupils: Pupils are equal, round, and reactive to light.  Neck:     Thyroid: No thyroid mass.     Trachea: No tracheal deviation.  Cardiovascular:     Rate and Rhythm: Normal rate and regular rhythm.     Heart sounds: Normal heart sounds. No murmur heard.   No gallop.  Pulmonary:     Effort: Pulmonary effort is normal. No respiratory distress.     Breath sounds: Normal breath sounds. No stridor. No decreased breath sounds, wheezing, rhonchi or rales.  Abdominal:     General: There is no distension.     Palpations: Abdomen is soft.     Tenderness: There is no abdominal tenderness. There is no rebound.  Musculoskeletal:        General: No tenderness. Normal range of motion.     Cervical back: Normal range of motion and neck supple.  Skin:    General: Skin is warm and dry.     Findings: No abrasion or rash.  Neurological:     Mental Status: She is alert and oriented to person, place, and time. Mental status is at baseline.     GCS: GCS eye subscore is 4. GCS verbal subscore is 5. GCS motor subscore is 6.     Cranial Nerves: Cranial nerves are intact. No cranial nerve deficit.     Sensory: No sensory deficit.     Motor: Motor function is intact.  Psychiatric:        Attention and Perception:  Attention normal.        Speech: Speech normal.        Behavior: Behavior normal.    ED Results / Procedures / Treatments   Labs (all labs ordered are listed, but only abnormal results are displayed) Labs Reviewed  COMPREHENSIVE METABOLIC PANEL  CBC WITH DIFFERENTIAL/PLATELET  TROPONIN I (HIGH SENSITIVITY)    EKG EKG Interpretation  Date/Time:  Tuesday September 16 2020 16:05:09 EDT Ventricular Rate:  82 PR Interval:  188 QRS Duration: 91 QT Interval:  381 QTC  Calculation: 445 R Axis:   -31 Text Interpretation: Age not entered, assumed to be  80 years old for purpose of ECG interpretation Sinus rhythm Anterolateral infarct, old inferior and lateral q waves unchanged from June 26 Confirmed by Derwood Kaplan [phone]) on 09/16/2020 4:10:24 PM  Radiology No results found.  Procedures Procedures   Medications Ordered in ED Medications - No data to display  ED Course  I have reviewed the triage vital signs and the nursing notes.  Pertinent labs & imaging results that were available during my care of the patient were reviewed by me and considered in my medical decision making (see chart for details).    MDM Rules/Calculators/A&P                           Patient with delta troponin here that was negative.  EKG without ischemic changes.  Labs are reassuring.  Will discharge home Final Clinical Impression(s) / ED Diagnoses Final diagnoses:  None    Rx / DC Orders ED Discharge Orders     None        Lorre Nick, MD 09/16/20 2025

## 2020-09-16 NOTE — ED Triage Notes (Signed)
Pt was seen at PCP today for regular check-up and had labs done. Pt was sent here for elevated troponin.

## 2020-09-17 DIAGNOSIS — R079 Chest pain, unspecified: Secondary | ICD-10-CM | POA: Diagnosis not present

## 2020-09-17 DIAGNOSIS — R9431 Abnormal electrocardiogram [ECG] [EKG]: Secondary | ICD-10-CM | POA: Diagnosis not present

## 2020-09-24 DIAGNOSIS — I10 Essential (primary) hypertension: Secondary | ICD-10-CM | POA: Diagnosis not present

## 2020-09-24 DIAGNOSIS — E7800 Pure hypercholesterolemia, unspecified: Secondary | ICD-10-CM | POA: Diagnosis not present

## 2020-09-24 DIAGNOSIS — E039 Hypothyroidism, unspecified: Secondary | ICD-10-CM | POA: Diagnosis not present

## 2020-09-24 DIAGNOSIS — E782 Mixed hyperlipidemia: Secondary | ICD-10-CM | POA: Diagnosis not present

## 2020-12-11 ENCOUNTER — Other Ambulatory Visit: Payer: Self-pay

## 2020-12-11 ENCOUNTER — Ambulatory Visit (INDEPENDENT_AMBULATORY_CARE_PROVIDER_SITE_OTHER): Payer: Medicare Other | Admitting: Podiatry

## 2020-12-11 DIAGNOSIS — M79675 Pain in left toe(s): Secondary | ICD-10-CM | POA: Diagnosis not present

## 2020-12-11 DIAGNOSIS — M79674 Pain in right toe(s): Secondary | ICD-10-CM

## 2020-12-11 DIAGNOSIS — B351 Tinea unguium: Secondary | ICD-10-CM

## 2020-12-12 DIAGNOSIS — I10 Essential (primary) hypertension: Secondary | ICD-10-CM | POA: Diagnosis not present

## 2020-12-12 DIAGNOSIS — E782 Mixed hyperlipidemia: Secondary | ICD-10-CM | POA: Diagnosis not present

## 2020-12-12 DIAGNOSIS — E039 Hypothyroidism, unspecified: Secondary | ICD-10-CM | POA: Diagnosis not present

## 2020-12-12 DIAGNOSIS — E7800 Pure hypercholesterolemia, unspecified: Secondary | ICD-10-CM | POA: Diagnosis not present

## 2020-12-14 NOTE — Progress Notes (Signed)
Subjective: 80 y.o. returns the office today for painful, elongated, thickened toenails which she cannot trim herself. Denies any redness or drainage around the nails.  No open lesions that she reports and she has no other concerns today.  PCP: Renford Dills, MD Last Seen: 10/23/2020  Objective: AAO 3, NAD DP/PT pulses palpable, CRT less than 3 seconds Nails hypertrophic, dystrophic, elongated, brittle, discolored 10. There is tenderness overlying the nails 1-5 bilaterally. There is no surrounding erythema or drainage along the nail sites. No open lesions or pre-ulcerative lesions are identified. No pain with calf compression, swelling, warmth, erythema.  Assessment: Patient presents with symptomatic onychomycosis  Plan: -Treatment options including alternatives, risks, complications were discussed -Nails sharply debrided 10 without complication/bleeding. -Discussed daily foot inspection. If there are any changes, to call the office immediately.  -Follow-up in 3 months or sooner if any problems are to arise. In the meantime, encouraged to call the office with any questions, concerns, changes symptoms.  Ovid Curd, DPM

## 2020-12-18 DIAGNOSIS — Z1231 Encounter for screening mammogram for malignant neoplasm of breast: Secondary | ICD-10-CM | POA: Diagnosis not present

## 2021-03-16 ENCOUNTER — Other Ambulatory Visit: Payer: Self-pay

## 2021-03-16 ENCOUNTER — Ambulatory Visit (INDEPENDENT_AMBULATORY_CARE_PROVIDER_SITE_OTHER): Payer: Medicare Other | Admitting: Podiatry

## 2021-03-16 DIAGNOSIS — B351 Tinea unguium: Secondary | ICD-10-CM | POA: Diagnosis not present

## 2021-03-16 DIAGNOSIS — M79675 Pain in left toe(s): Secondary | ICD-10-CM | POA: Diagnosis not present

## 2021-03-16 DIAGNOSIS — M79674 Pain in right toe(s): Secondary | ICD-10-CM | POA: Diagnosis not present

## 2021-03-18 NOTE — Progress Notes (Signed)
Subjective: 80 y.o. returns the office today for painful, elongated, thickened toenails which she cannot trim herself. Denies any redness or drainage around the nails.  No open lesions that she reports and she has no other concerns today.  PCP: Polite, Ronald, MD Last Seen: 10/23/2020  Objective: AAO 3, NAD DP/PT pulses palpable, CRT less than 3 seconds Nails hypertrophic, dystrophic, elongated, brittle, discolored 10. There is tenderness overlying the nails 1-5 bilaterally. There is no surrounding erythema or drainage along the nail sites. No open lesions or pre-ulcerative lesions are identified. No pain with calf compression, swelling, warmth, erythema.  Assessment: Patient presents with symptomatic onychomycosis  Plan: -Treatment options including alternatives, risks, complications were discussed -Nails sharply debrided 10 without complication/bleeding. -Discussed daily foot inspection. If there are any changes, to call the office immediately.  -Follow-up in 3 months or sooner if any problems are to arise. In the meantime, encouraged to call the office with any questions, concerns, changes symptoms.  Ewen Varnell, DPM  

## 2021-03-19 DIAGNOSIS — Z7189 Other specified counseling: Secondary | ICD-10-CM | POA: Diagnosis not present

## 2021-03-19 DIAGNOSIS — E7800 Pure hypercholesterolemia, unspecified: Secondary | ICD-10-CM | POA: Diagnosis not present

## 2021-03-19 DIAGNOSIS — I10 Essential (primary) hypertension: Secondary | ICD-10-CM | POA: Diagnosis not present

## 2021-03-19 DIAGNOSIS — E663 Overweight: Secondary | ICD-10-CM | POA: Diagnosis not present

## 2021-03-19 DIAGNOSIS — E039 Hypothyroidism, unspecified: Secondary | ICD-10-CM | POA: Diagnosis not present

## 2021-03-26 DIAGNOSIS — E039 Hypothyroidism, unspecified: Secondary | ICD-10-CM | POA: Diagnosis not present

## 2021-05-13 DIAGNOSIS — E039 Hypothyroidism, unspecified: Secondary | ICD-10-CM | POA: Diagnosis not present

## 2021-06-09 DIAGNOSIS — I10 Essential (primary) hypertension: Secondary | ICD-10-CM | POA: Diagnosis not present

## 2021-06-09 DIAGNOSIS — E782 Mixed hyperlipidemia: Secondary | ICD-10-CM | POA: Diagnosis not present

## 2021-06-09 DIAGNOSIS — E039 Hypothyroidism, unspecified: Secondary | ICD-10-CM | POA: Diagnosis not present

## 2021-06-15 ENCOUNTER — Ambulatory Visit (INDEPENDENT_AMBULATORY_CARE_PROVIDER_SITE_OTHER): Payer: Medicare Other | Admitting: Podiatry

## 2021-06-15 DIAGNOSIS — M79675 Pain in left toe(s): Secondary | ICD-10-CM | POA: Diagnosis not present

## 2021-06-15 DIAGNOSIS — B351 Tinea unguium: Secondary | ICD-10-CM

## 2021-06-15 DIAGNOSIS — M79674 Pain in right toe(s): Secondary | ICD-10-CM | POA: Diagnosis not present

## 2021-06-16 NOTE — Progress Notes (Signed)
Subjective: 80 y.o. returns the office today for painful, elongated, thickened toenails which she cannot trim herself. Denies any redness or drainage around the nails.  No open lesions that she reports and she has no other concerns today.  PCP: Polite, Ronald, MD  Objective: AAO 3, NAD DP/PT pulses palpable, CRT less than 3 seconds Nails hypertrophic, dystrophic, elongated, brittle, discolored 10. There is tenderness overlying the nails 1-5 bilaterally. There is no surrounding erythema or drainage along the nail sites. No open lesions or pre-ulcerative lesions are identified. No pain with calf compression, swelling, warmth, erythema.  Assessment: Patient presents with symptomatic onychomycosis  Plan: -Treatment options including alternatives, risks, complications were discussed -Nails sharply debrided 10 without complication/bleeding. -Discussed daily foot inspection. If there are any changes, to call the office immediately.  -Follow-up in 3 months or sooner if any problems are to arise. In the meantime, encouraged to call the office with any questions, concerns, changes symptoms.  Kotaro Buer, DPM 

## 2021-09-17 ENCOUNTER — Ambulatory Visit: Payer: Medicare Other | Admitting: Podiatry

## 2021-09-17 DIAGNOSIS — Z5181 Encounter for therapeutic drug level monitoring: Secondary | ICD-10-CM | POA: Diagnosis not present

## 2021-09-17 DIAGNOSIS — E7800 Pure hypercholesterolemia, unspecified: Secondary | ICD-10-CM | POA: Diagnosis not present

## 2021-09-17 DIAGNOSIS — E039 Hypothyroidism, unspecified: Secondary | ICD-10-CM | POA: Diagnosis not present

## 2021-09-17 DIAGNOSIS — E663 Overweight: Secondary | ICD-10-CM | POA: Diagnosis not present

## 2021-09-17 DIAGNOSIS — I10 Essential (primary) hypertension: Secondary | ICD-10-CM | POA: Diagnosis not present

## 2021-09-24 ENCOUNTER — Ambulatory Visit: Payer: Medicare Other | Admitting: Podiatry

## 2021-09-24 DIAGNOSIS — M79675 Pain in left toe(s): Secondary | ICD-10-CM | POA: Diagnosis not present

## 2021-09-24 DIAGNOSIS — M79674 Pain in right toe(s): Secondary | ICD-10-CM

## 2021-09-24 DIAGNOSIS — B351 Tinea unguium: Secondary | ICD-10-CM

## 2021-09-24 NOTE — Progress Notes (Signed)
Subjective: 81 y.o. returns the office today for painful, elongated, thickened toenails which she cannot trim herself. Denies any redness or drainage around the nails.  No open lesions that she reports and she has no other concerns today.  PCP: Renford Dills, MD  Objective: AAO 3, NAD DP/PT pulses palpable, CRT less than 3 seconds Nails hypertrophic, dystrophic, elongated, brittle, discolored 10. There is tenderness overlying the nails 1-5 bilaterally. There is no surrounding erythema or drainage along the nail sites. No open lesions or pre-ulcerative lesions are identified. No pain with calf compression, swelling, warmth, erythema.  Assessment: Patient presents with symptomatic onychomycosis  Plan: -Treatment options including alternatives, risks, complications were discussed -Nails sharply debrided 10 without complication/bleeding. -Discussed daily foot inspection. If there are any changes, to call the office immediately.  -Follow-up in 3 months or sooner if any problems are to arise. In the meantime, encouraged to call the office with any questions, concerns, changes symptoms.  Ovid Curd, DPM

## 2021-10-13 DIAGNOSIS — G5601 Carpal tunnel syndrome, right upper limb: Secondary | ICD-10-CM | POA: Diagnosis not present

## 2021-12-29 ENCOUNTER — Ambulatory Visit: Payer: Medicare Other | Admitting: Podiatry

## 2021-12-29 DIAGNOSIS — M79674 Pain in right toe(s): Secondary | ICD-10-CM | POA: Diagnosis not present

## 2021-12-29 DIAGNOSIS — M79675 Pain in left toe(s): Secondary | ICD-10-CM | POA: Diagnosis not present

## 2021-12-29 DIAGNOSIS — B351 Tinea unguium: Secondary | ICD-10-CM | POA: Diagnosis not present

## 2021-12-29 NOTE — Progress Notes (Signed)
Subjective: 81 y.o. returns the office today for painful, elongated, thickened toenails which she cannot trim herself. Denies any redness or drainage around the nails.  No open lesions that she reports and she has no other concerns today.  PCP: Seward Carol, MD  Objective: AAO 3, NAD DP/PT pulses palpable, CRT less than 3 seconds Nails hypertrophic, dystrophic, elongated, brittle, discolored 8. There is tenderness overlying the nails 2-5 bilaterally. There is no surrounding erythema or drainage along the nail sites. Left 2nd toenail is ingrown on the medial border without signs of infection.  No open lesions or pre-ulcerative lesions are identified. No pain with calf compression, swelling, warmth, erythema.  Assessment: Patient presents with symptomatic onychomycosis  Plan: -Treatment options including alternatives, risks, complications were discussed -Nails sharply debrided 8 without complication/bleeding. Monitor the ingrown toenail.  -Discussed daily foot inspection. If there are any changes, to call the office immediately.  -Follow-up in 3 months or sooner if any problems are to arise. In the meantime, encouraged to call the office with any questions, concerns, changes symptoms.  Celesta Gentile, DPM

## 2021-12-31 DIAGNOSIS — Z1231 Encounter for screening mammogram for malignant neoplasm of breast: Secondary | ICD-10-CM | POA: Diagnosis not present

## 2022-03-26 DIAGNOSIS — G5601 Carpal tunnel syndrome, right upper limb: Secondary | ICD-10-CM | POA: Diagnosis not present

## 2022-03-26 DIAGNOSIS — E7800 Pure hypercholesterolemia, unspecified: Secondary | ICD-10-CM | POA: Diagnosis not present

## 2022-03-26 DIAGNOSIS — R413 Other amnesia: Secondary | ICD-10-CM | POA: Diagnosis not present

## 2022-03-26 DIAGNOSIS — E039 Hypothyroidism, unspecified: Secondary | ICD-10-CM | POA: Diagnosis not present

## 2022-03-26 DIAGNOSIS — Z1331 Encounter for screening for depression: Secondary | ICD-10-CM | POA: Diagnosis not present

## 2022-03-26 DIAGNOSIS — Z0000 Encounter for general adult medical examination without abnormal findings: Secondary | ICD-10-CM | POA: Diagnosis not present

## 2022-04-01 ENCOUNTER — Ambulatory Visit (INDEPENDENT_AMBULATORY_CARE_PROVIDER_SITE_OTHER): Payer: PPO | Admitting: Podiatry

## 2022-04-01 DIAGNOSIS — M79674 Pain in right toe(s): Secondary | ICD-10-CM | POA: Diagnosis not present

## 2022-04-01 DIAGNOSIS — M79675 Pain in left toe(s): Secondary | ICD-10-CM | POA: Diagnosis not present

## 2022-04-01 DIAGNOSIS — B351 Tinea unguium: Secondary | ICD-10-CM

## 2022-04-01 NOTE — Progress Notes (Signed)
Subjective: Chief Complaint  Patient presents with   Nail Problem    Routine foot care   82 y.o. returns the office today for painful, elongated, thickened toenails which she cannot trim herself.  No swelling or redness to the toenail sites and she has no other concerns today.  PCP: Seward Carol, MD Last seen: 03/26/2022  Objective: AAO 3, NAD DP/PT pulses palpable, CRT less than 3 seconds Nails hypertrophic, dystrophic, elongated, brittle, discolored 8. There is tenderness overlying the nails 2-5 bilaterally. There is no surrounding erythema or drainage along the nail sites. No open lesions or pre-ulcerative lesions are identified. No pain with calf compression, swelling, warmth, erythema.  Assessment: Patient presents with symptomatic onychomycosis  Plan: -Treatment options including alternatives, risks, complications were discussed -Nails sharply debrided 8 without complication/bleeding. Monitor the ingrown toenail.  -Discussed daily foot inspection. If there are any changes, to call the office immediately.  -Follow-up in 3 months or sooner if any problems are to arise. In the meantime, encouraged to call the office with any questions, concerns, changes symptoms.  Celesta Gentile, DPM

## 2022-06-01 DIAGNOSIS — L570 Actinic keratosis: Secondary | ICD-10-CM | POA: Diagnosis not present

## 2022-06-01 DIAGNOSIS — L821 Other seborrheic keratosis: Secondary | ICD-10-CM | POA: Diagnosis not present

## 2022-07-01 ENCOUNTER — Ambulatory Visit: Payer: PPO | Admitting: Podiatry

## 2022-07-01 DIAGNOSIS — M79675 Pain in left toe(s): Secondary | ICD-10-CM | POA: Diagnosis not present

## 2022-07-01 DIAGNOSIS — B351 Tinea unguium: Secondary | ICD-10-CM | POA: Diagnosis not present

## 2022-07-01 DIAGNOSIS — M79674 Pain in right toe(s): Secondary | ICD-10-CM

## 2022-07-01 NOTE — Progress Notes (Signed)
Subjective: Chief Complaint  Patient presents with   Nail Problem    Nail trim    82 y.o. returns the office today for painful, elongated, thickened toenails which she cannot trim herself.  No swelling or redness to the toenail sites and she has no other concerns today.  PCP: Renford Dills, MD Last seen: 05/17/2022  Objective: AAO 3, NAD DP/PT pulses palpable, CRT less than 3 seconds Nails hypertrophic, dystrophic, elongated, brittle, discolored 8. There is tenderness overlying the nails 2-5 bilaterally. There is no surrounding erythema or drainage along the nail sites. No open lesions or pre-ulcerative lesions are identified. No pain with calf compression, swelling, warmth, erythema.  Assessment: Patient presents with symptomatic onychomycosis  Plan: -Treatment options including alternatives, risks, complications were discussed -Nails sharply debrided 8 without complication/bleeding. Monitor the ingrown toenail.  -Discussed daily foot inspection. If there are any changes, to call the office immediately.  -Follow-up in 3 months or sooner if any problems are to arise. In the meantime, encouraged to call the office with any questions, concerns, changes symptoms.  Ovid Curd, DPM

## 2022-09-30 ENCOUNTER — Ambulatory Visit: Payer: PPO | Admitting: Podiatry

## 2022-09-30 DIAGNOSIS — M79675 Pain in left toe(s): Secondary | ICD-10-CM

## 2022-09-30 DIAGNOSIS — B351 Tinea unguium: Secondary | ICD-10-CM

## 2022-09-30 DIAGNOSIS — M79674 Pain in right toe(s): Secondary | ICD-10-CM

## 2022-09-30 NOTE — Progress Notes (Signed)
Subjective: No chief complaint on file.   82 y.o. returns the office today for painful, elongated, thickened toenails which she cannot trim herself.  No swelling or redness to the toenail sites and she has no other concerns today.  No open lesions.  PCP: Renford Dills, MD Last seen: 05/17/2022  Objective: AAO 3, NAD DP/PT pulses palpable, CRT less than 3 seconds Nails hypertrophic, dystrophic, elongated, brittle, discolored 8. There is tenderness overlying the nails 2-5 bilaterally. There is no surrounding erythema or drainage along the nail sites. No open lesions or pre-ulcerative lesions are identified. No pain with calf compression, swelling, warmth, erythema. Overall, stable.   Assessment: Patient presents with symptomatic onychomycosis  Plan: -Treatment options including alternatives, risks, complications were discussed -Nails sharply debrided 8 without complication/bleeding. Monitor the ingrown toenail.  -Discussed daily foot inspection. If there are any changes, to call the office immediately.  -Follow-up in 3 months or sooner if any problems are to arise. In the meantime, encouraged to call the office with any questions, concerns, changes symptoms.  Ovid Curd, DPM

## 2022-10-01 DIAGNOSIS — Z5181 Encounter for therapeutic drug level monitoring: Secondary | ICD-10-CM | POA: Diagnosis not present

## 2022-10-01 DIAGNOSIS — E7800 Pure hypercholesterolemia, unspecified: Secondary | ICD-10-CM | POA: Diagnosis not present

## 2022-10-01 DIAGNOSIS — I10 Essential (primary) hypertension: Secondary | ICD-10-CM | POA: Diagnosis not present

## 2022-10-01 DIAGNOSIS — E039 Hypothyroidism, unspecified: Secondary | ICD-10-CM | POA: Diagnosis not present

## 2022-10-06 DIAGNOSIS — M1712 Unilateral primary osteoarthritis, left knee: Secondary | ICD-10-CM | POA: Diagnosis not present

## 2022-10-06 DIAGNOSIS — M25562 Pain in left knee: Secondary | ICD-10-CM | POA: Diagnosis not present

## 2022-12-10 IMAGING — CR DG CHEST 2V
2 series · 2 of 2 positions shown · non-contrast
Comparison: 09/13/2019

CLINICAL DATA: Chest pain

EXAM:
CHEST - 2 VIEW

[w chest pa]
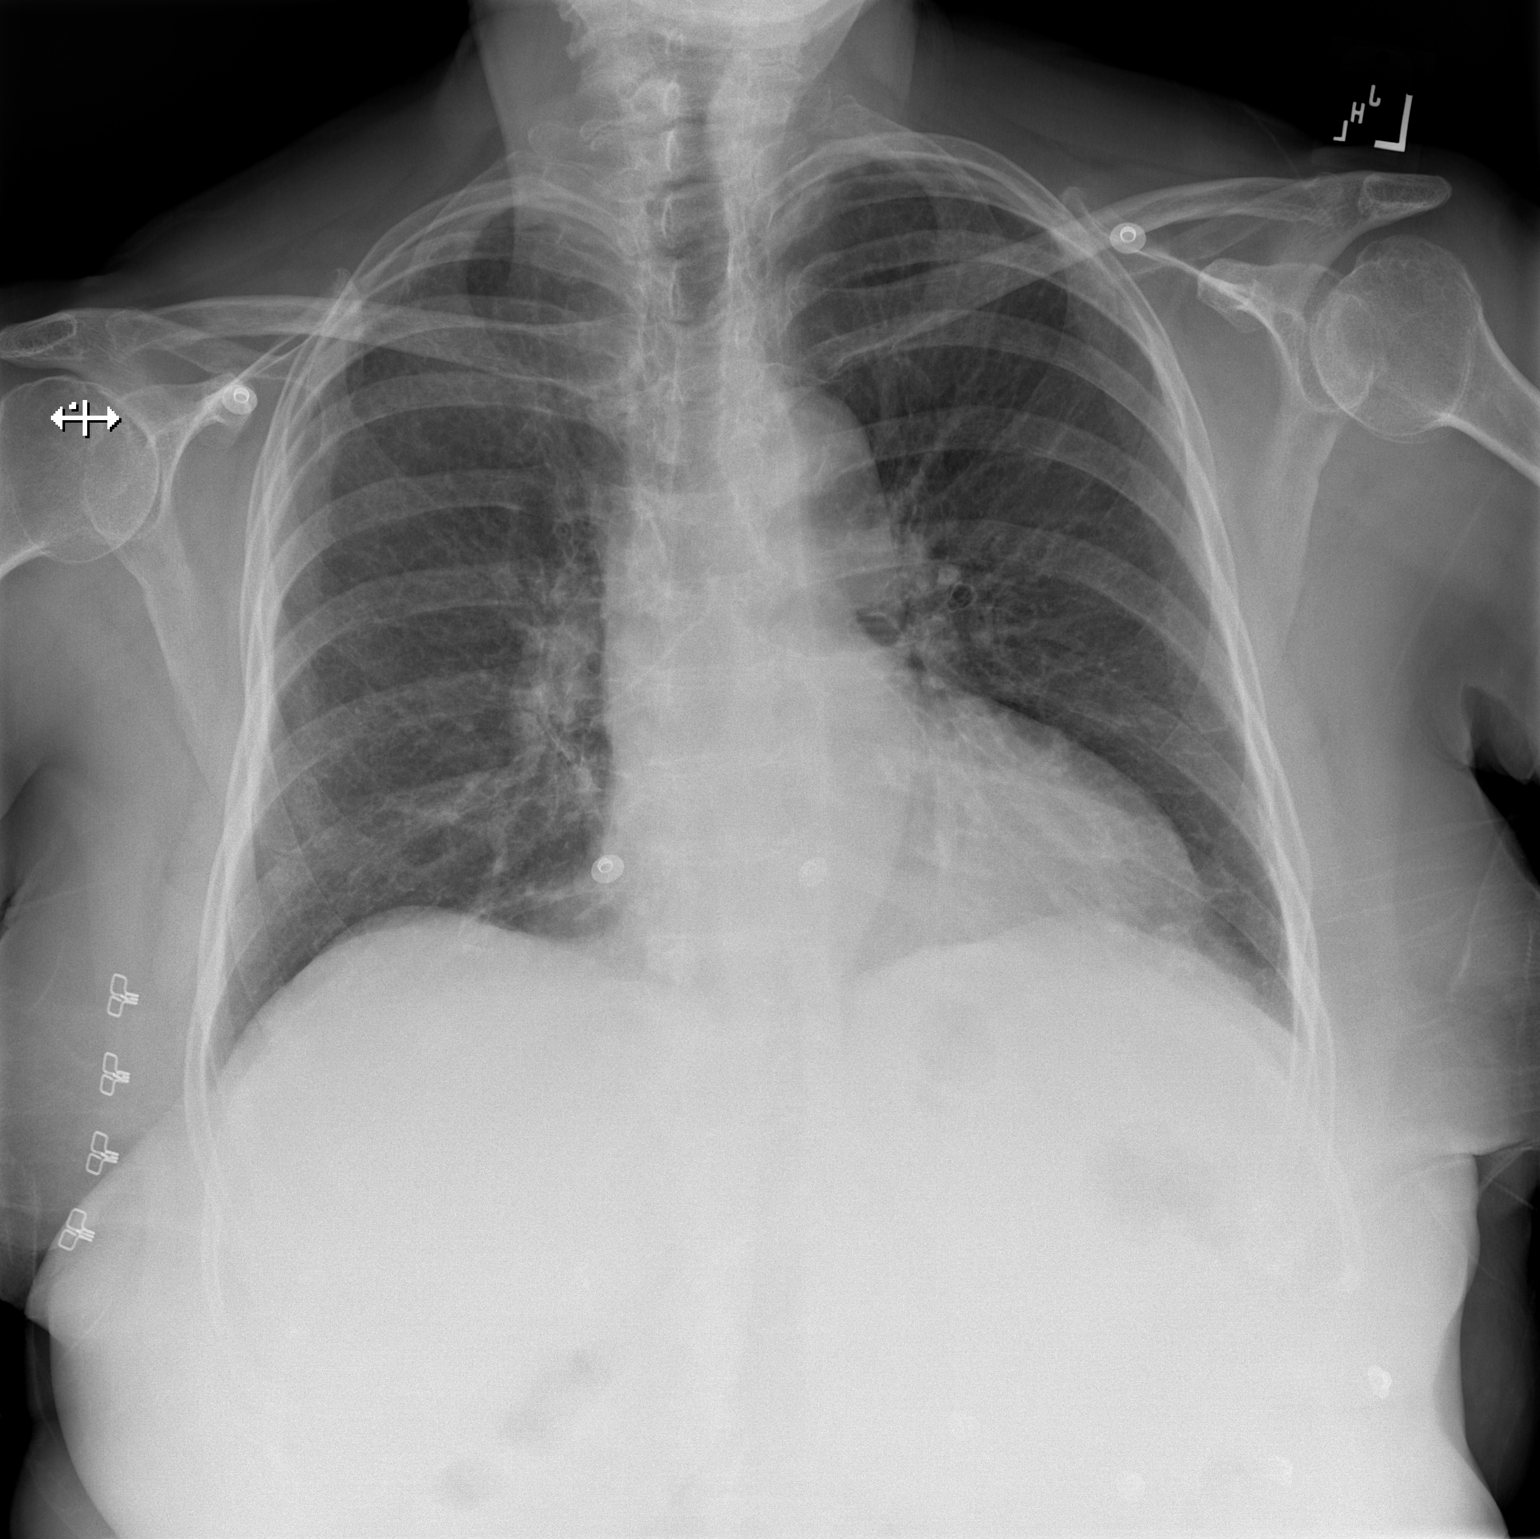

[w chest lat]
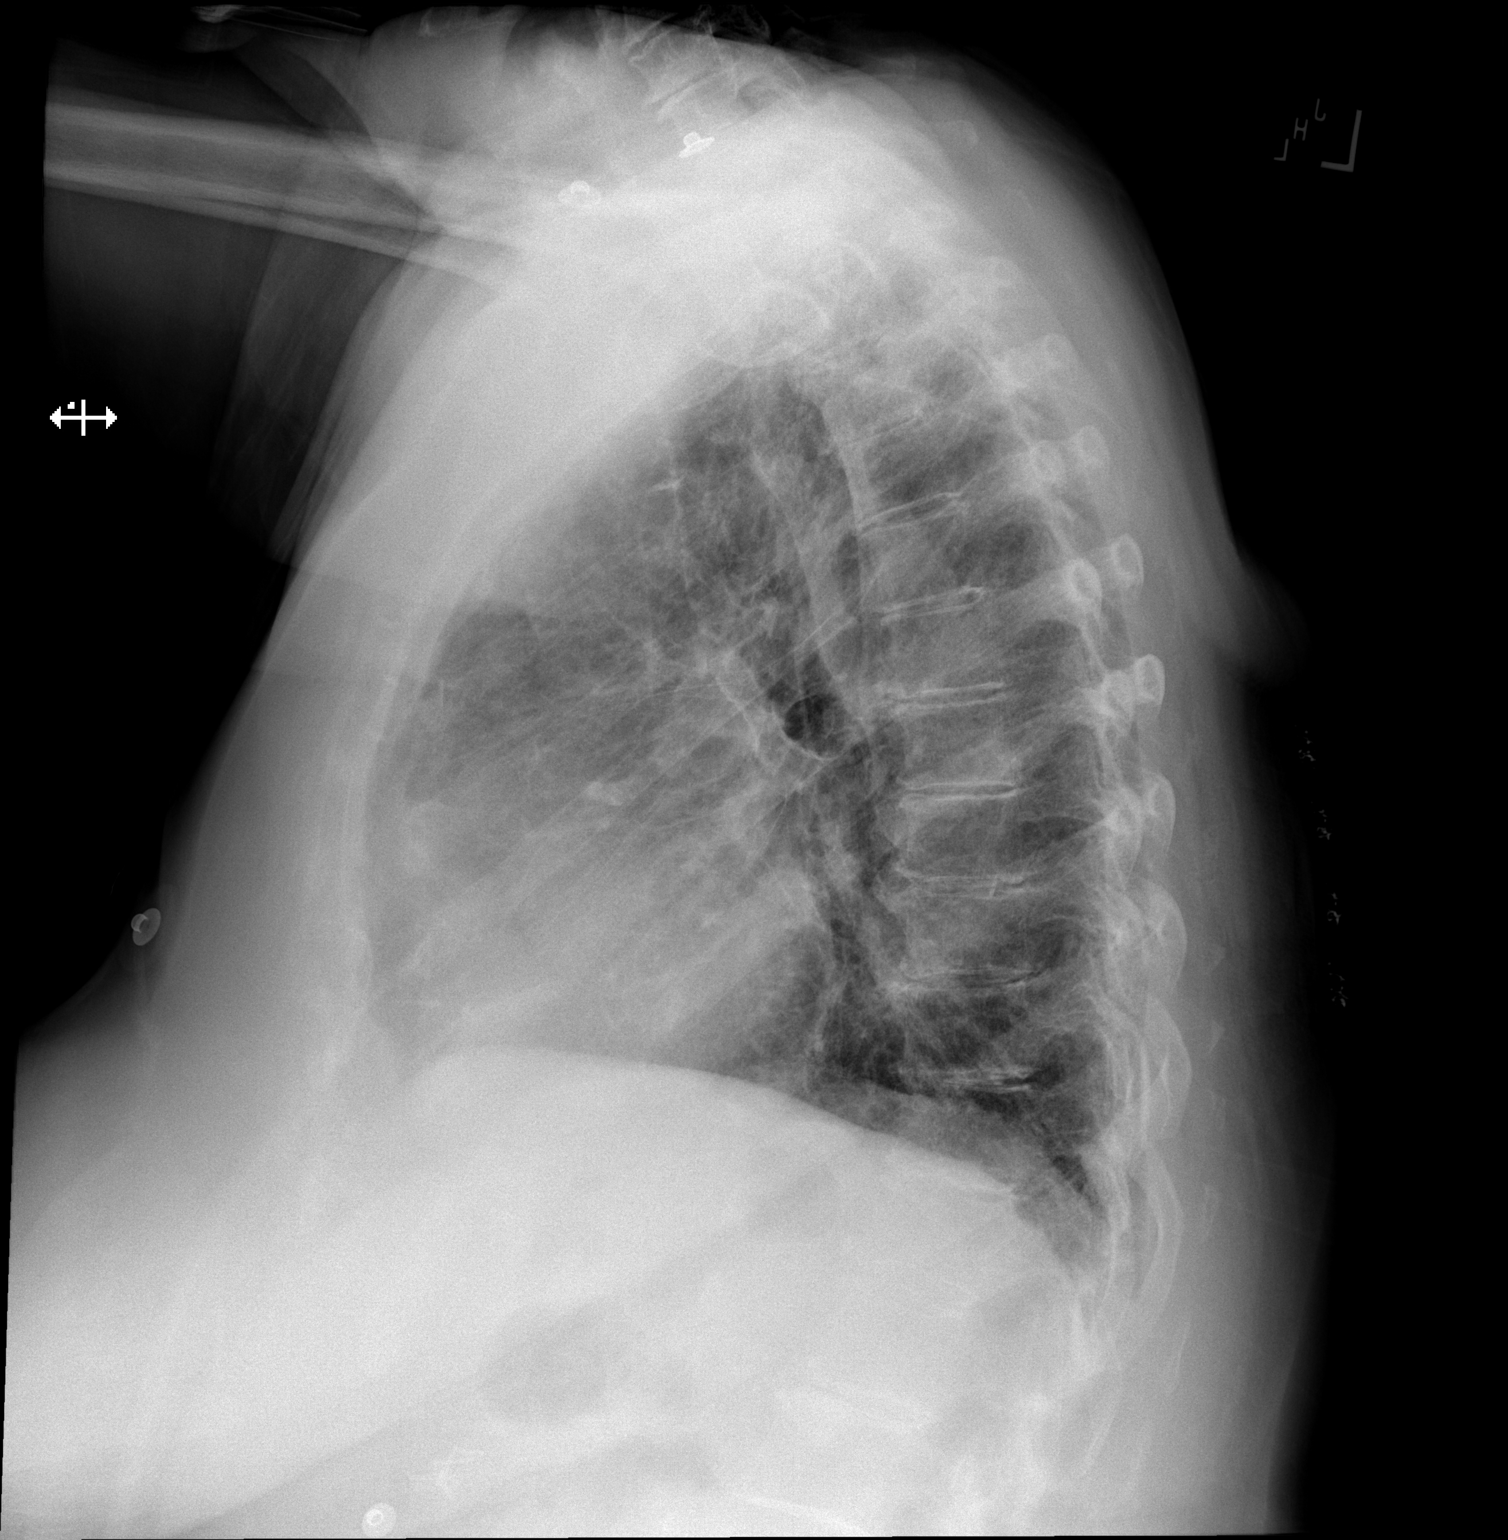

[2 of 2 positions shown; findings below may reference images not displayed]

FINDINGS: The heart size and mediastinal contours are within normal limits.
Both lungs are clear. The visualized skeletal structures are
unremarkable.
IMPRESSION: No active cardiopulmonary disease.

## 2022-12-31 ENCOUNTER — Ambulatory Visit: Payer: PPO | Admitting: Podiatry

## 2023-01-03 ENCOUNTER — Ambulatory Visit: Payer: PPO | Admitting: Podiatry

## 2023-01-03 ENCOUNTER — Encounter: Payer: Self-pay | Admitting: Podiatry

## 2023-01-03 DIAGNOSIS — M79674 Pain in right toe(s): Secondary | ICD-10-CM | POA: Diagnosis not present

## 2023-01-03 DIAGNOSIS — M79675 Pain in left toe(s): Secondary | ICD-10-CM | POA: Diagnosis not present

## 2023-01-03 DIAGNOSIS — B351 Tinea unguium: Secondary | ICD-10-CM

## 2023-01-03 NOTE — Progress Notes (Signed)
Subjective: Chief Complaint  Patient presents with   RFC    RM#11 RFC patient has no concerns at this time.    82 y.o. returns the office today for painful, elongated, thickened toenails which she cannot trim herself.  No swelling or redness to the toenail sites and she has no other concerns today.  No open lesions.  PCP: Renford Dills, MD Last seen: 10/01/2022  Objective: AAO 3, NAD DP/PT pulses palpable, CRT less than 3 seconds Nails hypertrophic, dystrophic, elongated, brittle, discolored 8. There is tenderness overlying the nails 2-5 bilaterally. There is no surrounding erythema or drainage along the nail sites. No open lesions or pre-ulcerative lesions are identified. No pain with calf compression, swelling, warmth, erythema. Assessment: Patient presents with symptomatic onychomycosis  Plan: -Treatment options including alternatives, risks, complications were discussed -Nails sharply debrided 8 without complication/bleeding. -Discussed daily foot inspection. If there are any changes, to call the office immediately.  -Follow-up in 3 months or sooner if any problems are to arise. In the meantime, encouraged to call the office with any questions, concerns, changes symptoms.  Ovid Curd, DPM

## 2023-01-05 DIAGNOSIS — Z1231 Encounter for screening mammogram for malignant neoplasm of breast: Secondary | ICD-10-CM | POA: Diagnosis not present

## 2023-03-31 DIAGNOSIS — Z5181 Encounter for therapeutic drug level monitoring: Secondary | ICD-10-CM | POA: Diagnosis not present

## 2023-03-31 DIAGNOSIS — E039 Hypothyroidism, unspecified: Secondary | ICD-10-CM | POA: Diagnosis not present

## 2023-03-31 DIAGNOSIS — Z0000 Encounter for general adult medical examination without abnormal findings: Secondary | ICD-10-CM | POA: Diagnosis not present

## 2023-03-31 DIAGNOSIS — E2839 Other primary ovarian failure: Secondary | ICD-10-CM | POA: Diagnosis not present

## 2023-03-31 DIAGNOSIS — E7800 Pure hypercholesterolemia, unspecified: Secondary | ICD-10-CM | POA: Diagnosis not present

## 2023-03-31 DIAGNOSIS — E663 Overweight: Secondary | ICD-10-CM | POA: Diagnosis not present

## 2023-04-04 ENCOUNTER — Encounter: Payer: Self-pay | Admitting: Podiatry

## 2023-04-04 ENCOUNTER — Ambulatory Visit: Payer: PPO | Admitting: Podiatry

## 2023-04-04 DIAGNOSIS — M79675 Pain in left toe(s): Secondary | ICD-10-CM | POA: Diagnosis not present

## 2023-04-04 DIAGNOSIS — B351 Tinea unguium: Secondary | ICD-10-CM | POA: Diagnosis not present

## 2023-04-04 DIAGNOSIS — M79674 Pain in right toe(s): Secondary | ICD-10-CM

## 2023-04-04 NOTE — Progress Notes (Signed)
 Subjective: Chief Complaint  Patient presents with   RFC    RM#14 RFC     83 y.o. returns the office today for painful, elongated, thickened toenails which she cannot trim herself.  She has no other concerns today.  PCP: Renford Dills, MD  Objective: AAO 3, NAD DP/PT pulses palpable, CRT less than 3 seconds Nails hypertrophic, dystrophic, elongated, brittle, discolored 8. There is tenderness overlying the nails 2-5 bilaterally. There is no surrounding erythema or drainage along the nail sites. No open lesions noted bilaterally.  No pain with calf compression, swelling, warmth, erythema. Assessment: Patient presents with symptomatic onychomycosis  Plan: -Treatment options including alternatives, risks, complications were discussed -Nails sharply debrided 8 without complication/bleeding. -Discussed daily foot inspection. If there are any changes, to call the office immediately.  -Follow-up in 3 months or sooner if any problems are to arise. In the meantime, encouraged to call the office with any questions, concerns, changes symptoms.  Return in about 3 months (around 07/05/2023).  Ovid Curd, DPM

## 2023-04-11 DIAGNOSIS — E2839 Other primary ovarian failure: Secondary | ICD-10-CM | POA: Diagnosis not present

## 2023-05-16 DIAGNOSIS — M25559 Pain in unspecified hip: Secondary | ICD-10-CM | POA: Diagnosis not present

## 2023-05-16 DIAGNOSIS — M545 Low back pain, unspecified: Secondary | ICD-10-CM | POA: Diagnosis not present

## 2023-07-05 ENCOUNTER — Ambulatory Visit: Admitting: Podiatry

## 2023-07-05 DIAGNOSIS — M79675 Pain in left toe(s): Secondary | ICD-10-CM | POA: Diagnosis not present

## 2023-07-05 DIAGNOSIS — B351 Tinea unguium: Secondary | ICD-10-CM | POA: Diagnosis not present

## 2023-07-05 DIAGNOSIS — M79674 Pain in right toe(s): Secondary | ICD-10-CM | POA: Diagnosis not present

## 2023-07-05 NOTE — Progress Notes (Signed)
 Subjective: Chief Complaint  Patient presents with   RFC    RM#11 RFC patient has no concerns today.    83 y.o. returns the office today for painful, elongated, thickened toenails which she cannot trim herself.  She has no other concerns today.  PCP: Merl Star, MD Last seen: 03/31/2023  Objective: AAO 3, NAD DP/PT pulses palpable, CRT less than 3 seconds Nails hypertrophic, dystrophic, elongated, brittle, discolored 8. There is tenderness overlying the nails 2-5 bilaterally. There is no surrounding erythema or drainage along the nail sites. No open lesions noted bilaterally.  No pain with calf compression, swelling, warmth, erythema. Assessment: Patient presents with symptomatic onychomycosis  Plan: -Treatment options including alternatives, risks, complications were discussed -Nails sharply debrided 8 without complication/bleeding. -Discussed daily foot inspection. If there are any changes, to call the office immediately.  -Follow-up in 3 months or sooner if any problems are to arise. In the meantime, encouraged to call the office with any questions, concerns, changes symptoms.    Bobbie Burows, DPM

## 2023-08-04 DIAGNOSIS — M5136 Other intervertebral disc degeneration, lumbar region with discogenic back pain only: Secondary | ICD-10-CM | POA: Diagnosis not present

## 2023-08-04 DIAGNOSIS — M25551 Pain in right hip: Secondary | ICD-10-CM | POA: Diagnosis not present

## 2023-09-05 DIAGNOSIS — M47816 Spondylosis without myelopathy or radiculopathy, lumbar region: Secondary | ICD-10-CM | POA: Diagnosis not present

## 2023-09-05 DIAGNOSIS — M533 Sacrococcygeal disorders, not elsewhere classified: Secondary | ICD-10-CM | POA: Diagnosis not present

## 2023-10-06 ENCOUNTER — Encounter: Payer: Self-pay | Admitting: Podiatry

## 2023-10-06 ENCOUNTER — Ambulatory Visit: Admitting: Podiatry

## 2023-10-06 VITALS — Ht 61.0 in | Wt 185.4 lb

## 2023-10-06 DIAGNOSIS — B351 Tinea unguium: Secondary | ICD-10-CM

## 2023-10-06 DIAGNOSIS — E2839 Other primary ovarian failure: Secondary | ICD-10-CM | POA: Diagnosis not present

## 2023-10-06 DIAGNOSIS — E039 Hypothyroidism, unspecified: Secondary | ICD-10-CM | POA: Diagnosis not present

## 2023-10-06 DIAGNOSIS — M545 Low back pain, unspecified: Secondary | ICD-10-CM | POA: Diagnosis not present

## 2023-10-06 DIAGNOSIS — R634 Abnormal weight loss: Secondary | ICD-10-CM | POA: Diagnosis not present

## 2023-10-06 DIAGNOSIS — M79674 Pain in right toe(s): Secondary | ICD-10-CM

## 2023-10-06 DIAGNOSIS — E7800 Pure hypercholesterolemia, unspecified: Secondary | ICD-10-CM | POA: Diagnosis not present

## 2023-10-06 DIAGNOSIS — M79675 Pain in left toe(s): Secondary | ICD-10-CM

## 2023-10-06 DIAGNOSIS — M25559 Pain in unspecified hip: Secondary | ICD-10-CM | POA: Diagnosis not present

## 2023-10-06 NOTE — Progress Notes (Signed)
 Subjective: Chief Complaint  Patient presents with   Nail Problem    Pt is here for RFC.    83 y.o. returns the office today for painful, elongated, thickened toenails which she cannot trim herself.  Her 2nd digit nails are getting thicker. She has no other concerns today.  PCP: Rexanne Ingle, MD Last seen: 09/28/2023  Objective: AAO 3, NAD DP/PT pulses palpable, CRT less than 3 seconds Nails hypertrophic, dystrophic, elongated, brittle, discolored 8. There is tenderness overlying the nails 2-5 bilaterally. There is no surrounding erythema or drainage along the nail sites. No open lesions noted bilaterally.  No pain with calf compression, swelling, warmth, erythema. Assessment: Patient presents with symptomatic onychomycosis  Plan: -Treatment options including alternatives, risks, complications were discussed -Nails sharply debrided 8 without complication/bleeding. -Discussed daily foot inspection. If there are any changes, to call the office immediately.  -Follow-up in 3 months or sooner if any problems are to arise. In the meantime, encouraged to call the office with any questions, concerns, changes symptoms.  Return in about 3 months (around 01/05/2024).  [name], DPM

## 2023-10-24 ENCOUNTER — Other Ambulatory Visit: Payer: Self-pay

## 2023-10-24 ENCOUNTER — Emergency Department (HOSPITAL_COMMUNITY)

## 2023-10-24 ENCOUNTER — Encounter (HOSPITAL_COMMUNITY): Payer: Self-pay

## 2023-10-24 ENCOUNTER — Emergency Department (HOSPITAL_COMMUNITY)
Admission: EM | Admit: 2023-10-24 | Discharge: 2023-10-24 | Disposition: A | Discharge status: Home / Self Care | Attending: Emergency Medicine | Admitting: Emergency Medicine

## 2023-10-24 DIAGNOSIS — R0789 Other chest pain: Secondary | ICD-10-CM | POA: Diagnosis not present

## 2023-10-24 DIAGNOSIS — R079 Chest pain, unspecified: Secondary | ICD-10-CM | POA: Diagnosis not present

## 2023-10-24 DIAGNOSIS — I700 Atherosclerosis of aorta: Secondary | ICD-10-CM | POA: Diagnosis not present

## 2023-10-24 DIAGNOSIS — I771 Stricture of artery: Secondary | ICD-10-CM | POA: Diagnosis not present

## 2023-10-24 LAB — BASIC METABOLIC PANEL WITH GFR
Anion gap: 9 (ref 5–15)
BUN: 15 mg/dL (ref 8–23)
CO2: 24 mmol/L (ref 22–32)
Calcium: 9.1 mg/dL (ref 8.9–10.3)
Chloride: 109 mmol/L (ref 98–111)
Creatinine, Ser: 0.98 mg/dL (ref 0.44–1.00)
GFR, Estimated: 57 mL/min — ABNORMAL LOW (ref >60)
Glucose, Bld: 94 mg/dL (ref 70–99)
Potassium: 4.1 mmol/L (ref 3.5–5.1)
Sodium: 142 mmol/L (ref 135–145)

## 2023-10-24 LAB — CBC
HCT: 37.6 % (ref 36.0–46.0)
Hemoglobin: 12.8 g/dL (ref 12.0–15.0)
MCH: 34 pg (ref 26.0–34.0)
MCHC: 34 g/dL (ref 30.0–36.0)
MCV: 99.7 fL (ref 80.0–100.0)
Platelets: 184 K/uL (ref 150–400)
RBC: 3.77 MIL/uL — ABNORMAL LOW (ref 3.87–5.11)
RDW: 12.7 % (ref 11.5–15.5)
WBC: 6.3 K/uL (ref 4.0–10.5)
nRBC: 0 % (ref 0.0–0.2)

## 2023-10-24 LAB — TROPONIN I (HIGH SENSITIVITY)
Troponin I (High Sensitivity): 5 ng/L (ref <18)
Troponin I (High Sensitivity): 5 ng/L (ref <18)

## 2023-10-24 NOTE — ED Triage Notes (Signed)
 Pt AAOX4, BIB [facility] from home. Started having chest pain this morning. 10/10 prior to EMS arrival 5/10 upon EMS arrival. No other complaints. Pt states chest pain has resolved.   EMS- 324 aspirin   20g LAC

## 2023-10-24 NOTE — ED Provider Notes (Signed)
 [place] EMERGENCY DEPARTMENT AT [facility] Provider Note   CSN: [id number] Arrival date & time: 10/24/23  9173     Patient presents with: Chest Pain   Sarah Benitez is a 83 y.o. female.    Chest Pain Patient presents with left-sided chest pain.  Woke with this morning.  States below the left breast.  States it felt as if she needed to burp.  Lasted about an hour and is now resolved.  Has not had pains like this before.  Been doing well otherwise.  No exertional symptoms.    Past Medical History:  Diagnosis Date   Arthritis    High cholesterol     Prior to Admission medications   Medication Sig Start Date End Date Taking? Authorizing Provider  acetaminophen  (TYLENOL ) 500 MG tablet Take 1,000 mg by mouth in the morning and at bedtime.   Yes [provider]  Calcium Carbonate (CALTRATE 600 PO) Take 1 tablet by mouth at bedtime.    Yes [provider]  Ginkgo Biloba (GINKOBA PO) Take 1 tablet by mouth daily.    Yes [provider]  Glucosamine-Chondroitin (GLUCOSAMINE CHONDR COMPLEX PO) Take 1 tablet by mouth daily.    Yes [provider]  levothyroxine  (SYNTHROID ) 88 MCG tablet Take 88 mcg by mouth every morning. 09/18/23  Yes [provider]  Multiple Vitamin (MULTIVITAMIN) capsule Take 1 capsule by mouth at bedtime.    Yes [provider]  nabumetone  (RELAFEN ) 500 MG tablet Take 1,000 mg by mouth in the morning.   Yes [provider]  nystatin (MYCOSTATIN/NYSTOP) 100000 UNIT/GM POWD Apply 1 application topically 2 (two) times daily as needed (for rashes). 06/20/14  Yes [provider]  Omega-3 Fatty Acids (OMEGA-3 FISH OIL PO) Take 1 capsule by mouth daily.    Yes [provider]  simvastatin (ZOCOR) 20 MG tablet Take 20 mg by mouth at bedtime.    Yes [provider]  ST JOHNS WORT EXTRACT PO Take 1 tablet by mouth daily.    Yes [provider]    Allergies: Allegra  [fexofenadine], Lipitor [atorvastatin], and Betadine [povidone iodine]    Review of Systems  Cardiovascular:  Positive for chest pain.    Updated Vital Signs BP (!) 158/63   Pulse 74   Temp 97.8 F (36.6 C) (Oral)   Resp 19   SpO2 99%   Physical Exam Vitals and nursing note reviewed.  Cardiovascular:     Rate and Rhythm: Regular rhythm.  Chest:     Chest wall: No tenderness.  Abdominal:     Tenderness: There is no abdominal tenderness.  Musculoskeletal:     Cervical back: Neck supple.     Right lower leg: No edema.  Neurological:     Mental Status: She is alert.     (all labs ordered are listed, but only abnormal results are displayed) Labs Reviewed  BASIC METABOLIC PANEL WITH GFR - Abnormal; Notable for the following components:      Result Value   GFR, Estimated 57 (*)    All other components within normal limits  CBC - Abnormal; Notable for the following components:   RBC 3.77 (*)    All other components within normal limits  TROPONIN I (HIGH SENSITIVITY)  TROPONIN I (HIGH SENSITIVITY)    EKG: EKG Interpretation Date/Time:  Monday October 24 2023 08:34:41 EDT Ventricular Rate:  71 PR Interval:  226 QRS Duration:  86 QT Interval:  429 QTC Calculation: 467  R Axis:   -34  Text Interpretation: Sinus rhythm Prolonged PR interval Inferior infarct, old Anterior infarct, old No significant change since last tracing Confirmed by Patsey Lot [id number]) on 10/24/2023 8:38:48 AM  Radiology: DG Chest Portable 1 View Result Date: 10/24/2023 CLINICAL DATA:  chest pain EXAM: PORTABLE CHEST - 1 VIEW COMPARISON:  09/16/2020 FINDINGS: No focal airspace consolidation, pleural effusion, or pneumothorax. No cardiomegaly. Tortuous aorta with aortic atherosclerosis. No acute fracture or destructive lesions. Multilevel thoracic osteophytosis. IMPRESSION: No acute cardiopulmonary abnormality. Electronically Signed   By: Rogelia Myers M.D.   On: 10/24/2023 09:41      Procedures   Medications Ordered in the ED - No data to display                                  Medical Decision Making Amount and/or Complexity of Data Reviewed Labs: ordered. Radiology: ordered.   Patient with chest pain.  Left chest.  Differential diagnoses longed includes causes such as coronary artery disease, ACS, aortic dissection pneumonia, GI cause.  EKG reassuring.  Will get basic blood work and x-ray.  Blood work and x-ray reassuring.  Troponin negative x 2.  Appears stable for discharge home.     Final diagnoses:  Nonspecific chest pain    ED Discharge Orders     None          Patsey Lot, MD 10/24/23 1511

## 2023-12-12 DIAGNOSIS — H81392 Other peripheral vertigo, left ear: Secondary | ICD-10-CM | POA: Diagnosis not present

## 2023-12-12 DIAGNOSIS — K219 Gastro-esophageal reflux disease without esophagitis: Secondary | ICD-10-CM | POA: Diagnosis not present

## 2023-12-12 DIAGNOSIS — R0789 Other chest pain: Secondary | ICD-10-CM | POA: Diagnosis not present

## 2024-01-05 ENCOUNTER — Ambulatory Visit: Admitting: Podiatry

## 2024-01-05 ENCOUNTER — Encounter: Payer: Self-pay | Admitting: Podiatry

## 2024-01-05 VITALS — Ht 61.0 in | Wt 185.4 lb

## 2024-01-05 DIAGNOSIS — M79675 Pain in left toe(s): Secondary | ICD-10-CM

## 2024-01-05 DIAGNOSIS — B351 Tinea unguium: Secondary | ICD-10-CM | POA: Diagnosis not present

## 2024-01-05 DIAGNOSIS — M79674 Pain in right toe(s): Secondary | ICD-10-CM

## 2024-01-08 NOTE — Progress Notes (Signed)
 Subjective: Chief Complaint  Patient presents with   Nail Problem    Pt is here for RFC.    83 y.o. returns the office today for painful, elongated, thickened toenails which she cannot trim herself.  No other concerns today.  PCP: Sarah Ingle, MD Last seen: 09/28/2023  Objective: AAO 3, NAD DP/PT pulses palpable, CRT less than 3 seconds Nails hypertrophic, dystrophic, elongated, brittle, discolored 8. There is tenderness overlying the nails 2-5 bilaterally. There is no surrounding erythema or drainage along the nail sites. No open lesions noted bilaterally.  No pain with calf compression, swelling, warmth, erythema. Assessment: Patient presents with symptomatic onychomycosis  Plan: -Treatment options including alternatives, risks, complications were discussed -Nails sharply debrided 8 without complication/bleeding. -Discussed daily foot inspection. If there are any changes, to call the office immediately.  -Follow-up in 3 months or sooner if any problems are to arise. In the meantime, encouraged to call the office with any questions, concerns, changes symptoms.  Return in about 3 months (around 04/04/2024).  Sarah Benitez, DPM

## 2024-01-11 DIAGNOSIS — Z1231 Encounter for screening mammogram for malignant neoplasm of breast: Secondary | ICD-10-CM | POA: Diagnosis not present

## 2024-04-05 ENCOUNTER — Ambulatory Visit: Admitting: Podiatry
